# Patient Record
Sex: Male | Born: 1990 | Smoking: Former smoker
Health system: Southern US, Community
[De-identification: ages and names within clinical notes are randomized; demographics above are authoritative.]

## PROBLEM LIST (undated history)

## (undated) DIAGNOSIS — I10 Essential (primary) hypertension: Secondary | ICD-10-CM

## (undated) DIAGNOSIS — K219 Gastro-esophageal reflux disease without esophagitis: Secondary | ICD-10-CM

## (undated) DIAGNOSIS — M25562 Pain in left knee: Secondary | ICD-10-CM

## (undated) DIAGNOSIS — M25561 Pain in right knee: Secondary | ICD-10-CM

## (undated) DIAGNOSIS — I071 Rheumatic tricuspid insufficiency: Secondary | ICD-10-CM

## (undated) DIAGNOSIS — E039 Hypothyroidism, unspecified: Secondary | ICD-10-CM

## (undated) DIAGNOSIS — F419 Anxiety disorder, unspecified: Secondary | ICD-10-CM

## (undated) DIAGNOSIS — F32A Depression, unspecified: Secondary | ICD-10-CM

## (undated) DIAGNOSIS — J45909 Unspecified asthma, uncomplicated: Secondary | ICD-10-CM

## (undated) DIAGNOSIS — G8929 Other chronic pain: Secondary | ICD-10-CM

## (undated) DIAGNOSIS — D72819 Decreased white blood cell count, unspecified: Secondary | ICD-10-CM

## (undated) HISTORY — DX: Anxiety disorder, unspecified: F41.9

## (undated) HISTORY — DX: Decreased white blood cell count, unspecified: D72.819

## (undated) HISTORY — DX: Other chronic pain: G89.29

## (undated) HISTORY — DX: Essential (primary) hypertension: I10

## (undated) HISTORY — DX: Rheumatic tricuspid insufficiency: I07.1

## (undated) HISTORY — DX: Depression, unspecified: F32.A

## (undated) HISTORY — DX: Unspecified asthma, uncomplicated: J45.909

## (undated) HISTORY — DX: Gastro-esophageal reflux disease without esophagitis: K21.9

## (undated) HISTORY — DX: Hypothyroidism, unspecified: E03.9

## (undated) HISTORY — DX: Pain in right knee: M25.561

## (undated) HISTORY — DX: Pain in left knee: M25.562

---

## 2014-01-28 DIAGNOSIS — J45909 Unspecified asthma, uncomplicated: Secondary | ICD-10-CM | POA: Insufficient documentation

## 2014-10-26 DIAGNOSIS — H52223 Regular astigmatism, bilateral: Secondary | ICD-10-CM | POA: Insufficient documentation

## 2014-10-26 DIAGNOSIS — H5213 Myopia, bilateral: Secondary | ICD-10-CM | POA: Insufficient documentation

## 2018-06-19 ENCOUNTER — Inpatient Hospital Stay
Admit: 2018-06-19 | Discharge: 2018-06-19 | Disposition: A | Discharge status: Home / Self Care | Payer: TRICARE (CHAMPUS) | Attending: Emergency Medicine

## 2018-06-19 ENCOUNTER — Emergency Department: Admit: 2018-06-19 | Payer: TRICARE (CHAMPUS)

## 2018-06-19 DIAGNOSIS — S83015A Lateral dislocation of left patella, initial encounter: Secondary | ICD-10-CM

## 2018-06-19 MED ORDER — PROPOFOL 10 MG/ML IV EMUL
10 mg/mL | INTRAVENOUS | Status: AC
Start: 2018-06-19 — End: 2018-06-19
  Administered 2018-06-19 (×3): via INTRAVENOUS

## 2018-06-19 MED ORDER — FENTANYL CITRATE (PF) 50 MCG/ML IJ SOLN
50 mcg/mL | Freq: Once | INTRAMUSCULAR | Status: AC
Start: 2018-06-19 — End: 2018-06-19
  Administered 2018-06-19: 16:00:00 via INTRAVENOUS

## 2018-06-19 MED ORDER — FENTANYL CITRATE (PF) 50 MCG/ML IJ SOLN
50 mcg/mL | INTRAMUSCULAR | Status: AC
Start: 2018-06-19 — End: 2018-06-19
  Administered 2018-06-19: 16:00:00 via INTRAVENOUS

## 2018-06-19 MED ORDER — IBUPROFEN 800 MG TAB
800 mg | ORAL_TABLET | Freq: Three times a day (TID) | ORAL | 0 refills | Status: AC
Start: 2018-06-19 — End: 2018-06-24

## 2018-06-19 MED FILL — FENTANYL CITRATE (PF) 50 MCG/ML IJ SOLN: 50 mcg/mL | INTRAMUSCULAR | Qty: 2

## 2018-06-19 MED FILL — PROPOFOL 10 MG/ML IV EMUL: 10 mg/mL | INTRAVENOUS | Qty: 20

## 2018-06-19 NOTE — ED Notes (Signed)
Pt arrived via [facility] with c/o dislocation left patellar. Pain 10/10

## 2018-06-19 NOTE — ED Notes (Signed)
I have reviewed discharge instruction and prescriptions with patient.  Patient verbalized understanding and has no further questions at this time.  Education taught and patient verbalized understanding of education.  Teach back method used.     Right ac IV removed, catheter tip intact on removal.  Armband removed and shredded per patients request.    Patients pain 4/10.  Belongings given to patient.  Patient discharged with friends to home.

## 2018-06-19 NOTE — ED Notes (Signed)
Pt discharged via w/c.

## 2018-06-19 NOTE — ED Provider Notes (Signed)
EMERGENCY DEPARTMENT HISTORY AND PHYSICAL EXAM    11:51 AM      Date: (Not on file)  Patient Name: Grant Cortez    History of Presenting Illness     No chief complaint on file.        History Provided By: patient    Additional History (Context): Grant Cortez is a 27 y.o. male [place] and made a simple turn and placing torque on his knee and sustained severe leg pain.????He has a obvious patellar deformity it appears the patella is displaced laterally and internally rotated.????His pain is severe with certain movements at rest.????EMS gave him 50 mcg of fentanyl in route.    History and review of systems limited by critical illness.Marland Kitchen    PCP: Grant Plumber, MD    Chief Complaint:   Duration:????  Timing:????  Location:   Quality:   Severity:   Modifying Factors:   Associated Symptoms:           Past History     Past Medical History:  Past Medical History:   Diagnosis Date   ??? Hypertension????      Past Surgical History:  No past surgical history on file.    Family History:  No family history on file.    Social History:  Social History     Tobacco Use   ??? Smoking status: Current Some Day Smoker   Substance Use Topics   ??? Alcohol use: Yes   ????Comment: social   ??? Drug use: Not on file       Allergies:  Allergies   Allergen Reactions   ??? Penicillins Hives         Review of Systems     Review of Systems   Respiratory: Negative for shortness of breath.????  Cardiovascular: Negative for chest pain.   Musculoskeletal: Positive for joint swelling.         Physical Exam       No data found.    Physical Exam   Constitutional: He appears well-developed and well-nourished.   HENT:   Head: Normocephalic and atraumatic.   Eyes: Conjunctivae are normal. No scleral icterus.   Neck: Normal range of motion. Neck supple. No JVD present.   Cardiovascular: Normal rate, regular rhythm and normal heart sounds.   4 intact extremity pulses   Pulmonary/Chest: Effort normal and breath sounds normal.   Abdominal:  Soft. He exhibits no mass. There is no tenderness.   Musculoskeletal: Normal range of motion.   Left leg splinted by EMS and this was removed strong dorsal pedis and posterior tibial pulses motor and sensory intact, patella is displaced laterally and internally rotated.   Lymphadenopathy:   ????He has no cervical adenopathy.   Neurological: He is alert.   Skin: Skin is warm and dry.   Nursing note and vitals reviewed.        Diagnostic Study Results   Labs -  No results found for this or any previous visit (from the past 12 hour(s)).    Radiologic Studies -   No orders to display     No results found.    Medications ordered:   Medications - No data to display????    Medical Decision Making   Initial Medical Decision Making and DDx:  Clinically this is a patellar dislocation.????Do not suspect fracture given the mechanism of injury.  Had informed discussion with the patient including options of performing x-ray for fracture which I  do not think will be present, additional pain medication which will take more time or attempt immediate reduction and he agrees with attempting immediate reduction.????This was attempted with placing trying to displace the patella medially.????It would not move the patient had great pain and so he stopped.????Whole effort lasted a few seconds.????No alarming complications.????We will give him additional pain medication and try reduction.    ED Course: Progress Notes, Reevaluation, and Consults:    Procedural Sedation  Date/Time: 06/19/2018 12:33 PM  Performed by: Donzetta Sprung, MD  Authorized by: Donzetta Sprung, MD     Consent:     Consent obtained:  Written    Consent given by:  Patient    Risks discussed:  Respiratory compromise necessitating ventilatory assistance and intubation    Alternatives discussed:  Analgesia without sedation  Universal protocol:     Patient identity confirmation method:  Verbally with patient, arm band and provided demographic data  Indications:     Sedation is required to  allow for: dislocation reduction.    Procedure necessitating sedation performed by:  Different physician    Intended level of sedation:  Moderate (conscious sedation)  Pre-sedation assessment:     Time since last food or drink:  Last oral 6:30 am    ASA classification: class 1 - normal, healthy patient      Neck mobility: normal      Mouth opening:  3 or more finger widths    Thyromental distance:  4 finger widths    Mallampati score:  I - soft palate, uvula, fauces, pillars visible    Pre-sedation assessments completed and reviewed: airway patency and mental status      History of difficult intubation: no      Pre-sedation assessment completed:  06/19/2018 12:34 PM  Procedure details (see MAR for exact dosages):     Sedation start time:  06/19/2018 12:45 PM    Preoxygenation:  Nasal cannula    Sedation:  Propofol    Intra-procedure monitoring:  Blood pressure monitoring, cardiac monitor, continuous capnometry, continuous pulse oximetry, frequent LOC assessments and frequent vital sign checks    Intra-procedure events: none      Sedation end time:  06/19/2018 12:51 PM  Post-procedure details:     Post-sedation assessment completed:  06/19/2018 12:58 PM    Attendance: Constant attendance by certified staff until patient recovered      Recovery: Patient returned to pre-procedure baseline      Patient is stable for discharge or admission: yes      Patient tolerance:  Tolerated well, no immediate complications  DISLOCATION LOWER-EXT (ASAP ONLY)  Date/Time: 06/19/2018 12:59 PM  Performed by: Deborra Medina, MD  Authorized by: Deborra Medina, MD     Consent:     Consent obtained:  Written    Consent given by:  Patient    Alternatives discussed:  No treatment and alternative treatment  Location:     Location:  Knee    Knee injury location:  L knee    Knee dislocation type: patellar    Pre-procedure details:     Distal perfusion: normal      Range of motion: reduced    Sedation:     Sedation type:  Moderate  (conscious) sedation  Procedure details:     Manipulation performed: yes      Knee reduction method: Patellar manipulation.  Post-procedure details:     Neurological function: normal      Distal perfusion: normal  Range of motion: improved      Patient tolerance of procedure:  Tolerated well, no immediate complications      1:36 PM Pt reevaluated at this time.  Discussed results and findings, as well as, diagnosis and plan for discharge. Follow up with doctors/services listed.  Return to the emergency department for alarming symptoms.  Pt verbalizes understanding and agreement with plan. All questions addressed.    His pain at a 4 out of 10, discussed use of NSAIDs, cold therapy, rest, Ace wrap, follow-up with orthopedics.  Questions answered.  He is entirely recovered from his sedation.      I am the first provider for this patient.    I reviewed the vital signs, available nursing notes, past medical history, past surgical history, family history and social history.    No data found.    Vital Signs-Reviewed the patient's vital signs.    Pulse Oximetry Analysis, Cardiac Monitor, 12 lead ekg:  ??   Interpreted by the EP.      Records Reviewed: Nursing notes reviewed (Time of Review: 11:51 AM)    Procedures:   Critical Care Time:   Aspirin: (was aspirin given for stroke?)    Diagnosis     Clinical Impression: No diagnosis found.    Disposition:       Follow-up Information????  None        Patient's Medications   No medications on file     _______________________________    Notes:????  Deborra Medinaharles I Finnlee Guarnieri, MD using Dragon dictation????????

## 2018-06-19 NOTE — ED Notes (Signed)
Pt waiting on Xray disc.

## 2018-06-19 NOTE — ED Notes (Signed)
Procedural Sedation  Date/Time: 06/19/2018 2:10 PM  Performed by: Donzetta SprungSingh, Breckin Zafar, MD  Authorized by: Donzetta SprungSingh, Sparkles Mcneely, MD     Consent:     Consent obtained:  Written    Consent given by:  Patient    Risks discussed:  Allergic reaction, dysrhythmia, inadequate sedation, nausea, vomiting, respiratory compromise necessitating ventilatory assistance and intubation, prolonged sedation necessitating reversal and prolonged hypoxia resulting in organ damage    Alternatives discussed:  Analgesia without sedation  Universal protocol:     Procedure explained and questions answered to patient or proxy's satisfaction: yes      Relevant documents present and verified: yes      Test results available and properly labeled: yes      Imaging studies available: yes      Required blood products, implants, devices, and special equipment available: yes      Site/side marked: yes      Immediately prior to procedure a time out was called: yes      Patient identity confirmation method:  Verbally with patient and hospital-assigned identification number  Indications:     Procedure performed:  Dislocation reduction    Procedure necessitating sedation performed by:  Different physician    Intended level of sedation:  Deep  Pre-sedation assessment:     NPO status caution: urgency dictates proceeding with non-ideal NPO status      Neck mobility: normal      Mouth opening:  3 or more finger widths    Mallampati score:  I - soft palate, uvula, fauces, pillars visible    Pre-sedation assessments completed and reviewed: airway patency, cardiovascular function, hydration status, mental status, nausea/vomiting, pain level, respiratory function and temperature      History of difficult intubation: no      Pre-sedation assessment completed:  06/19/2018 12:30 PM  Immediate pre-procedure details:     Reviewed: vital signs, relevant labs/tests and NPO status      Verified: bag valve mask available, emergency equipment available, intubation equipment  available, IV patency confirmed, oxygen available, reversal medications available and suction available    Procedure details (see MAR for exact dosages):     Sedation start time:  06/19/2018 12:30 PM    Preoxygenation:  Nasal cannula    Sedation:  Propofol    Analgesia:  Fentanyl    Intra-procedure monitoring:  Blood pressure monitoring, cardiac monitor, continuous capnometry, continuous pulse oximetry, frequent LOC assessments and frequent vital sign checks    Intra-procedure events: none      Sedation end time:  06/19/2018 12:45 PM    Total sedation time (minutes):  15  Post-procedure details:     Post-sedation assessment completed:  06/19/2018 12:50 PM    Attendance: Constant attendance by certified staff until patient recovered      Recovery: Patient returned to pre-procedure baseline      Estimated blood loss (see I/O flowsheets): no      Post-sedation assessments completed and reviewed: airway patency, cardiovascular function, hydration status, mental status, nausea/vomiting, pain level, respiratory function and temperature      Specimens recovered:  None    Patient is stable for discharge or admission: yes      Patient tolerance:  Tolerated well, no immediate complications

## 2018-06-19 NOTE — ED Notes (Signed)
Pt waiting on Xray disc.

## 2018-06-19 NOTE — ED Notes (Signed)
Procedural Sedation  Date/Time: 06/19/2018 2:10 PM  Performed by: Donzetta SprungSingh, Alexandra Lipps, MD  Authorized by: Donzetta SprungSingh, Ryett Hamman, MD     Consent:     Consent obtained:  Written    Consent given by:  Patient    Risks discussed:  Allergic reaction, dysrhythmia, inadequate sedation, nausea, vomiting, respiratory compromise necessitating ventilatory assistance and intubation, prolonged sedation necessitating reversal and prolonged hypoxia resulting in organ damage    Alternatives discussed:  Analgesia without sedation  Universal protocol:     Procedure explained and questions answered to patient or proxy's satisfaction: yes      Relevant documents present and verified: yes      Test results available and properly labeled: yes      Imaging studies available: yes      Required blood products, implants, devices, and special equipment available: yes      Site/side marked: yes      Immediately prior to procedure a time out was called: yes      Patient identity confirmation method:  Verbally with patient and hospital-assigned identification number  Indications:     Procedure performed:  Dislocation reduction    Procedure necessitating sedation performed by:  Different physician    Intended level of sedation:  Deep  Pre-sedation assessment:     NPO status caution: urgency dictates proceeding with non-ideal NPO status      Neck mobility: normal      Mouth opening:  3 or more finger widths    Mallampati score:  I - soft palate, uvula, fauces, pillars visible    Pre-sedation assessments completed and reviewed: airway patency, cardiovascular function, hydration status, mental status, nausea/vomiting, pain level, respiratory function and temperature      History of difficult intubation: no      Pre-sedation assessment completed:  06/19/2018 12:30 PM  Immediate pre-procedure details:     Reviewed: vital signs, relevant labs/tests and NPO status      Verified: bag valve mask available, emergency equipment available,  intubation equipment available, IV patency confirmed, oxygen available, reversal medications available and suction available    Procedure details (see MAR for exact dosages):     Sedation start time:  06/19/2018 12:30 PM    Preoxygenation:  Nasal cannula    Sedation:  Propofol    Analgesia:  Fentanyl    Intra-procedure monitoring:  Blood pressure monitoring, cardiac monitor, continuous capnometry, continuous pulse oximetry, frequent LOC assessments and frequent vital sign checks    Intra-procedure events: none      Sedation end time:  06/19/2018 12:45 PM    Total sedation time (minutes):  15  Post-procedure details:     Post-sedation assessment completed:  06/19/2018 12:50 PM    Attendance: Constant attendance by certified staff until patient recovered      Recovery: Patient returned to pre-procedure baseline      Estimated blood loss (see I/O flowsheets): no      Post-sedation assessments completed and reviewed: airway patency, cardiovascular function, hydration status, mental status, nausea/vomiting, pain level, respiratory function and temperature      Specimens recovered:  None    Patient is stable for discharge or admission: yes      Patient tolerance:  Tolerated well, no immediate complications

## 2018-06-19 NOTE — ED Notes (Signed)
I have reviewed discharge instruction and prescriptions with patient.  Patient verbalized understanding and has no further questions at this time.  Education taught and patient verbalized understanding of education.  Teach back method used.     Right ac IV removed, catheter tip intact on removal.  Armband removed and shredded per patients request.    Patients pain 4/10.  Belongings given to patient.  Patient discharged with friends to home.

## 2018-06-19 NOTE — ED Triage Notes (Signed)
Pt arrived via [facility] with c/o dislocation left patellar. Pain 10/10

## 2018-06-19 NOTE — ED Provider Notes (Signed)
EMERGENCY DEPARTMENT HISTORY AND PHYSICAL EXAM    11:51 AM      Date: (Not on file)  Patient Name: Grant Cortez    History of Presenting Illness     No chief complaint on file.        History Provided By: patient    Additional History (Context): Grant Cortez is a 27 y.o. male [place] and made a simple turn and placing torque on his knee and sustained severe leg pain.????He has a obvious patellar deformity it appears the patella is displaced laterally and internally rotated.????His pain is severe with certain movements at rest.????EMS gave him 50 mcg of fentanyl in route.    History and review of systems limited by critical illness.Marland Kitchen    PCP: Karle Plumber, MD    Chief Complaint:   Duration:????  Timing:????  Location:   Quality:   Severity:   Modifying Factors:   Associated Symptoms:           Past History     Past Medical History:  Past Medical History:   Diagnosis Date   ??? Hypertension????      Past Surgical History:  No past surgical history on file.    Family History:  No family history on file.    Social History:  Social History     Tobacco Use   ??? Smoking status: Current Some Day Smoker   Substance Use Topics   ??? Alcohol use: Yes   ????Comment: social   ??? Drug use: Not on file       Allergies:  Allergies   Allergen Reactions   ??? Penicillins Hives         Review of Systems     Review of Systems   Respiratory: Negative for shortness of breath.????  Cardiovascular: Negative for chest pain.   Musculoskeletal: Positive for joint swelling.         Physical Exam       No data found.    Physical Exam   Constitutional: He appears well-developed and well-nourished.   HENT:   Head: Normocephalic and atraumatic.   Eyes: Conjunctivae are normal. No scleral icterus.   Neck: Normal range of motion. Neck supple. No JVD present.   Cardiovascular: Normal rate, regular rhythm and normal heart sounds.   4 intact extremity pulses   Pulmonary/Chest: Effort normal and breath sounds normal.    Abdominal: Soft. He exhibits no mass. There is no tenderness.   Musculoskeletal: Normal range of motion.   Left leg splinted by EMS and this was removed strong dorsal pedis and posterior tibial pulses motor and sensory intact, patella is displaced laterally and internally rotated.   Lymphadenopathy:   ????He has no cervical adenopathy.   Neurological: He is alert.   Skin: Skin is warm and dry.   Nursing note and vitals reviewed.        Diagnostic Study Results   Labs -  No results found for this or any previous visit (from the past 12 hour(s)).    Radiologic Studies -   No orders to display     No results found.    Medications ordered:   Medications - No data to display????    Medical Decision Making   Initial Medical Decision Making and DDx:  Clinically this is a patellar dislocation.????Do not suspect fracture given the mechanism of injury.  Had informed discussion with the patient including options of performing x-ray for fracture which I  do not think will be present, additional pain medication which will take more time or attempt immediate reduction and he agrees with attempting immediate reduction.????This was attempted with placing trying to displace the patella medially.????It would not move the patient had great pain and so he stopped.????Whole effort lasted a few seconds.????No alarming complications.????We will give him additional pain medication and try reduction.    ED Course: Progress Notes, Reevaluation, and Consults:    Procedural Sedation  Date/Time: 06/19/2018 12:33 PM  Performed by: Donzetta Sprung, MD  Authorized by: Donzetta Sprung, MD     Consent:     Consent obtained:  Written    Consent given by:  Patient    Risks discussed:  Respiratory compromise necessitating ventilatory assistance and intubation    Alternatives discussed:  Analgesia without sedation  Universal protocol:     Patient identity confirmation method:  Verbally with patient, arm band and provided demographic data  Indications:      Sedation is required to allow for: dislocation reduction.    Procedure necessitating sedation performed by:  Different physician    Intended level of sedation:  Moderate (conscious sedation)  Pre-sedation assessment:     Time since last food or drink:  Last oral 6:30 am    ASA classification: class 1 - normal, healthy patient      Neck mobility: normal      Mouth opening:  3 or more finger widths    Thyromental distance:  4 finger widths    Mallampati score:  I - soft palate, uvula, fauces, pillars visible    Pre-sedation assessments completed and reviewed: airway patency and mental status      History of difficult intubation: no      Pre-sedation assessment completed:  06/19/2018 12:34 PM  Procedure details (see MAR for exact dosages):     Sedation start time:  06/19/2018 12:45 PM    Preoxygenation:  Nasal cannula    Sedation:  Propofol    Intra-procedure monitoring:  Blood pressure monitoring, cardiac monitor, continuous capnometry, continuous pulse oximetry, frequent LOC assessments and frequent vital sign checks    Intra-procedure events: none      Sedation end time:  06/19/2018 12:51 PM  Post-procedure details:     Post-sedation assessment completed:  06/19/2018 12:58 PM    Attendance: Constant attendance by certified staff until patient recovered      Recovery: Patient returned to pre-procedure baseline      Patient is stable for discharge or admission: yes      Patient tolerance:  Tolerated well, no immediate complications  DISLOCATION LOWER-EXT (ASAP ONLY)  Date/Time: 06/19/2018 12:59 PM  Performed by: Deborra Medina, MD  Authorized by: Deborra Medina, MD     Consent:     Consent obtained:  Written    Consent given by:  Patient    Alternatives discussed:  No treatment and alternative treatment  Location:     Location:  Knee    Knee injury location:  L knee    Knee dislocation type: patellar    Pre-procedure details:     Distal perfusion: normal      Range of motion: reduced    Sedation:      Sedation type:  Moderate (conscious) sedation  Procedure details:     Manipulation performed: yes      Knee reduction method: Patellar manipulation.  Post-procedure details:     Neurological function: normal      Distal perfusion: normal  Range of motion: improved      Patient tolerance of procedure:  Tolerated well, no immediate complications      1:36 PM Pt reevaluated at this time.  Discussed results and findings, as well as, diagnosis and plan for discharge. Follow up with doctors/services listed.  Return to the emergency department for alarming symptoms.  Pt verbalizes understanding and agreement with plan. All questions addressed.    His pain at a 4 out of 10, discussed use of NSAIDs, cold therapy, rest, Ace wrap, follow-up with orthopedics.  Questions answered.  He is entirely recovered from his sedation.      I am the first provider for this patient.    I reviewed the vital signs, available nursing notes, past medical history, past surgical history, family history and social history.    No data found.    Vital Signs-Reviewed the patient's vital signs.    Pulse Oximetry Analysis, Cardiac Monitor, 12 lead ekg:  ??   Interpreted by the EP.      Records Reviewed: Nursing notes reviewed (Time of Review: 11:51 AM)    Procedures:   Critical Care Time:   Aspirin: (was aspirin given for stroke?)    Diagnosis     Clinical Impression: No diagnosis found.    Disposition:       Follow-up Information????  None        Patient's Medications   No medications on file     _______________________________    Notes:????  Deborra Medinaharles I Armando Lauman, MD using Dragon dictation????????

## 2019-07-25 DIAGNOSIS — H5210 Myopia, unspecified eye: Secondary | ICD-10-CM | POA: Insufficient documentation

## 2020-02-15 ENCOUNTER — Emergency Department: Admit: 2020-02-16

## 2020-02-15 DIAGNOSIS — F418 Other specified anxiety disorders: Secondary | ICD-10-CM

## 2020-02-15 NOTE — ED Notes (Signed)
 Patient has been provided discharge instructions.  My Chart and [facility] has been discussed, and if any prescriptions were provided or called in, they have been reviewed with the patient, as well.  Allowed time for questions and clarification.

## 2020-02-15 NOTE — ED Notes (Signed)
Chest pain left side. Tender in upper left chest to palpation.  States he is nauseous.    No diarrhea. Did not take any ibuprofen or tylenol before coming in    Xray just done    labds drawn after iv inserted    Ride home is girlfriend

## 2020-02-15 NOTE — ED Provider Notes (Signed)
EMERGENCY DEPARTMENT HISTORY AND PHYSICAL EXAM      Date: 02/15/2020  Patient Name: Grant Cortez    History of Presenting Illness     Chief Complaint   Patient presents with   ??? Chest Pain       History (Context): Raymont Andreoni is a 29 y.o. gentleman with active conditions as noted in the past medical history who presents with subacute onset, progressive, mild to moderate, intermittent chest pain has been happening on and off over a long period of time..        On review of systems, the patient denies fever, chills, headache, anhedonia, current drug use, homicidal ideation, suicidal ideation.    PCP: None        Past History     Past Medical History:  Past Medical History:   Diagnosis Date   ??? Psychiatric disorder     anxiety       Past Surgical History:  History reviewed. No pertinent surgical history.    Family History:  History reviewed. No pertinent family history.    Social History:  Social History     Tobacco Use   ??? Smoking status: Current Every Day Smoker     Last attempt to quit: 11/25/2017     Years since quitting: 2.2   ??? Smokeless tobacco: Never Used   Substance Use Topics   ??? Alcohol use: Yes     Alcohol/week: 14.0 standard drinks     Types: 14 Shots of liquor per week   ??? Drug use: Not Currently       Allergies:  No Known Allergies    PMH, PSH, family history, social history, allergies reviewed with the patient with significant items noted above.  Review of Systems   As per HPI, otherwise reviewed and negative.     Physical Exam     Vitals:    02/15/20 2030 02/15/20 2045 02/15/20 2100 02/15/20 2115   BP: 131/85 138/76 137/72 122/68   Pulse: (!) 57 (!) 58 (!) 55 (!) 58   Resp: 21 25 20 15    Temp:       SpO2: 100% 100% 100% 99%   Weight:       Height:           Gen: Anxious appearing and appears mildly distressed  HEENT: Normocephalic, sclera anicteric  Cardiovascular: Normal rate, regular rhythm, no murmurs, rubs, gallops.  Pulses intact and equal distally.  Pulmonary: No respiratory distress.  No  stridor.  Clear lungs. Tachypneic  ABD: Soft, nontender, nondistended.  Neuro: Alert.  Normal speech.  Normal mentation.  Psych: Dress: Normal. Behavior: Anxious. Thought Process: Linear.  Thought content: Centers around symptoms.    GU: No CVA tenderness  EXT: Moves all extremities well.  No cyanosis or clubbing.  Skin: Warm and well-perfused.        Diagnostic Study Results     Labs -     No results found for this or any previous visit (from the past 12 hour(s)).    Radiologic Studies -   XR CHEST PORT   Final Result      No active cardiopulmonary disease.           CT Results  (Last 48 hours)    None        CXR Results  (Last 48 hours)    None            Medical Decision Making   I am the first provider for  this patient.    I reviewed the vital signs, available nursing notes, past medical history, past surgical history, family history and social history.    Vital Signs-Reviewed the patient's vital signs.    EKG:  Interpreted by myself.      Records Reviewed: Personally, on initial evaluation    MDM:   Patient presents with chest pain and anxiety.  Exam significant for normal exam with exception of anxiety.  Patient condition stable.   DDX considered: Anxiety, metabolic abnormality, endocrine abnormality  DDX thought to be less likely but also considered due to high risk condition: Cardiopulmonary dysfunction    Plan:   Reassurance and treatment    Orders as below:  Orders Placed This Encounter   ??? XR CHEST PORT   ??? CBC WITH AUTOMATED DIFF   ??? BASIC METABOLIC PANEL   ??? TROPONIN I   ??? EKG, 12 LEAD, INITIAL   ??? citalopram (CeleXA) 20 mg tablet        ED Course:   Work-up as above negative for other cause of acute chest pain.    DISCHARGE NOTE:     Care plan outlined and precautions discussed.  Results were reviewed with the patient. All medications were reviewed with the patient; will d/c home with citalopram. All of pt's questions and concerns were addressed.  Alarm symptoms and return precautions associated with  chief complaint and evaluation were reviewed with the patient in detail.  The patient demonstrated adequate understanding.  Patient was instructed and agrees to follow up with psychiatry, as well as to return to the ED upon further deterioration. Patient is ready to go home.  The patient is happy with this plan    Follow-up Information     Follow up With Specialties Details Why [place]    [facility] EMERGENCY DEPT Emergency Medicine Go to  As needed, If symptoms worsen [place]  [phone]    Your behavioral health provider  Call in 1 day            Discharge Medication List as of 02/15/2020  9:32 PM            Diagnosis     Clinical Impression:   1. Anxiety associated with depression        Signed,  Denali Sharma Geni Bers, MD  Emergency Physician  [facility]  [place]    As a voice dictation software was utilized to dictate this note, minor word transpositions can occur.  I apologize for confusing wording and typographic errors.  Please feel free to contact me for clarification.

## 2020-02-15 NOTE — ED Notes (Signed)
 Ambulatory to triage. C/o intermittent left-sided chest pain with shortness of breath. Sometimes I feel like I forget to breathe. Reports he has had some stress triggers recently and has been feeling anxious. My uncle died a year ago from covid and his birthday was recently. And I'm about to move to [place]  so I'm trying to get my money right.

## 2020-02-15 NOTE — ED Triage Notes (Addendum)
Ambulatory to triage. C/o intermittent left-sided chest pain with shortness of breath. "Sometimes I feel like I forget to breathe." Reports he has had some stress triggers recently and has been feeling anxious. "My uncle died a year ago from covid and his birthday was recently. And I'm about to move to [place] so I'm trying to get my money right."

## 2020-02-15 NOTE — ED Provider Notes (Signed)
EMERGENCY DEPARTMENT HISTORY AND PHYSICAL EXAM      Date: 02/15/2020  Patient Name: Grant Cortez    History of Presenting Illness     Chief Complaint   Patient presents with   ??? Chest Pain       History (Context): Raymont Andreoni is a 29 y.o. gentleman with active conditions as noted in the past medical history who presents with subacute onset, progressive, mild to moderate, intermittent chest pain has been happening on and off over a long period of time..        On review of systems, the patient denies fever, chills, headache, anhedonia, current drug use, homicidal ideation, suicidal ideation.    PCP: None        Past History     Past Medical History:  Past Medical History:   Diagnosis Date   ??? Psychiatric disorder     anxiety       Past Surgical History:  History reviewed. No pertinent surgical history.    Family History:  History reviewed. No pertinent family history.    Social History:  Social History     Tobacco Use   ??? Smoking status: Current Every Day Smoker     Last attempt to quit: 11/25/2017     Years since quitting: 2.2   ??? Smokeless tobacco: Never Used   Substance Use Topics   ??? Alcohol use: Yes     Alcohol/week: 14.0 standard drinks     Types: 14 Shots of liquor per week   ??? Drug use: Not Currently       Allergies:  No Known Allergies    PMH, PSH, family history, social history, allergies reviewed with the patient with significant items noted above.  Review of Systems   As per HPI, otherwise reviewed and negative.     Physical Exam     Vitals:    02/15/20 2030 02/15/20 2045 02/15/20 2100 02/15/20 2115   BP: 131/85 138/76 137/72 122/68   Pulse: (!) 57 (!) 58 (!) 55 (!) 58   Resp: 21 25 20 15    Temp:       SpO2: 100% 100% 100% 99%   Weight:       Height:           Gen: Anxious appearing and appears mildly distressed  HEENT: Normocephalic, sclera anicteric  Cardiovascular: Normal rate, regular rhythm, no murmurs, rubs, gallops.  Pulses intact and equal distally.  Pulmonary: No respiratory distress.  No  stridor.  Clear lungs. Tachypneic  ABD: Soft, nontender, nondistended.  Neuro: Alert.  Normal speech.  Normal mentation.  Psych: Dress: Normal. Behavior: Anxious. Thought Process: Linear.  Thought content: Centers around symptoms.    GU: No CVA tenderness  EXT: Moves all extremities well.  No cyanosis or clubbing.  Skin: Warm and well-perfused.        Diagnostic Study Results     Labs -     No results found for this or any previous visit (from the past 12 hour(s)).    Radiologic Studies -   XR CHEST PORT   Final Result      No active cardiopulmonary disease.           CT Results  (Last 48 hours)    None        CXR Results  (Last 48 hours)    None            Medical Decision Making   I am the first provider for  this patient.    I reviewed the vital signs, available nursing notes, past medical history, past surgical history, family history and social history.    Vital Signs-Reviewed the patient's vital signs.    EKG:  Interpreted by myself.      Records Reviewed: Personally, on initial evaluation    MDM:   Patient presents with chest pain and anxiety.  Exam significant for normal exam with exception of anxiety.  Patient condition stable.   DDX considered: Anxiety, metabolic abnormality, endocrine abnormality  DDX thought to be less likely but also considered due to high risk condition: Cardiopulmonary dysfunction    Plan:   Reassurance and treatment    Orders as below:  Orders Placed This Encounter   ??? XR CHEST PORT   ??? CBC WITH AUTOMATED DIFF   ??? BASIC METABOLIC PANEL   ??? TROPONIN I   ??? EKG, 12 LEAD, INITIAL   ??? citalopram (CeleXA) 20 mg tablet        ED Course:   Work-up as above negative for other cause of acute chest pain.    DISCHARGE NOTE:     Care plan outlined and precautions discussed.  Results were reviewed with the patient. All medications were reviewed with the patient; will d/c home with citalopram. All of pt's questions and concerns were addressed.  Alarm symptoms and return precautions associated with  chief complaint and evaluation were reviewed with the patient in detail.  The patient demonstrated adequate understanding.  Patient was instructed and agrees to follow up with psychiatry, as well as to return to the ED upon further deterioration. Patient is ready to go home.  The patient is happy with this plan    Follow-up Information     Follow up With Specialties Details Why Contact Info    [facility] EMERGENCY DEPT Emergency Medicine Go to  As needed, If symptoms worsen [address]  [place] [address]  [phone]    Your behavioral health provider  Call in 1 day            Discharge Medication List as of 02/15/2020  9:32 PM            Diagnosis     Clinical Impression:   1. Anxiety associated with depression        Signed,  Cecia Egge M Fleming Prill, MD  Emergency Physician  [facility]  [facility]    As a voice dictation software was utilized to dictate this note, minor word transpositions can occur.  I apologize for confusing wording and typographic errors.  Please feel free to contact me for clarification.

## 2020-02-15 NOTE — ED Notes (Signed)
Patient has been provided discharge instructions.  My Chart and [facility] has been discussed, and if any prescriptions were provided or called in, they have been reviewed with the patient, as well.  Allowed time for questions and clarification.

## 2020-02-15 NOTE — ED Notes (Signed)
Chest pain left side. Tender in upper left chest to palpation.  States he is nauseous.    No diarrhea. Did not take any ibuprofen or tylenol before coming in    Xray just done    labds drawn after iv inserted    Ride home is girlfriend

## 2020-02-16 ENCOUNTER — Inpatient Hospital Stay
Admit: 2020-02-16 | Discharge: 2020-02-16 | Disposition: A | Discharge status: Home / Self Care | Attending: Emergency Medicine

## 2020-02-16 LAB — METABOLIC PANEL, BASIC
Anion gap: 4 mmol/L (ref 3.0–18)
BUN/Creatinine ratio: 9 — ABNORMAL LOW (ref 12–20)
BUN: 10 MG/DL (ref 7.0–18)
CO2: 28 mmol/L (ref 21–32)
Calcium: 9 MG/DL (ref 8.5–10.1)
Chloride: 106 mmol/L (ref 100–111)
Creatinine: 1.12 MG/DL (ref 0.6–1.3)
GFR est AA: >60 mL/min/{1.73_m2} (ref >60)
GFR est non-AA: >60 mL/min/{1.73_m2} (ref >60)
Glucose: 96 mg/dL (ref 74–99)
Potassium: 3.7 mmol/L (ref 3.5–5.5)
Sodium: 138 mmol/L (ref 136–145)

## 2020-02-16 LAB — CBC WITH AUTOMATED DIFF
ABS. BASOPHILS: 0.1 10*3/uL (ref 0.0–0.1)
ABS. EOSINOPHILS: 0.1 10*3/uL (ref 0.0–0.4)
ABS. LYMPHOCYTES: 2.1 10*3/uL (ref 0.9–3.6)
ABS. MONOCYTES: 0.7 10*3/uL (ref 0.05–1.2)
ABS. NEUTROPHILS: 2.2 10*3/uL (ref 1.8–8.0)
BASOPHILS: 1 % (ref 0–2)
EOSINOPHILS: 2 % (ref 0–5)
HCT: 43.6 % (ref 36.0–48.0)
HGB: 14.3 g/dL (ref 13.0–16.0)
LYMPHOCYTES: 41 % (ref 21–52)
MCH: 32.1 PG (ref 24.0–34.0)
MCHC: 32.8 g/dL (ref 31.0–37.0)
MCV: 98 FL — ABNORMAL HIGH (ref 74.0–97.0)
MONOCYTES: 13 % — ABNORMAL HIGH (ref 3–10)
MPV: 12.3 FL — ABNORMAL HIGH (ref 9.2–11.8)
NEUTROPHILS: 43 % (ref 40–73)
PLATELET: 179 10*3/uL (ref 135–420)
RBC: 4.45 M/uL — ABNORMAL LOW (ref 4.70–5.50)
RDW: 11.2 % — ABNORMAL LOW (ref 11.6–14.5)
WBC: 5.1 10*3/uL (ref 4.6–13.2)

## 2020-02-16 LAB — TROPONIN I: Troponin-I, QT: <0.02 NG/ML (ref 0.0–0.045)

## 2020-02-16 LAB — CBC WITH AUTO DIFFERENTIAL
Basophils %: 1 % (ref 0–2)
Basophils Absolute: 0.1 10*3/uL (ref 0.0–0.1)
Eosinophils %: 2 % (ref 0–5)
Eosinophils Absolute: 0.1 10*3/uL (ref 0.0–0.4)
Hematocrit: 43.6 % (ref 36.0–48.0)
Hemoglobin: 14.3 g/dL (ref 13.0–16.0)
Lymphocytes %: 41 % (ref 21–52)
Lymphocytes Absolute: 2.1 10*3/uL (ref 0.9–3.6)
MCH: 32.1 PG (ref 24.0–34.0)
MCHC: 32.8 g/dL (ref 31.0–37.0)
MCV: 98 FL — ABNORMAL HIGH (ref 74.0–97.0)
MPV: 12.3 FL — ABNORMAL HIGH (ref 9.2–11.8)
Monocytes %: 13 % — ABNORMAL HIGH (ref 3–10)
Monocytes Absolute: 0.7 10*3/uL (ref 0.05–1.2)
Neutrophils %: 43 % (ref 40–73)
Neutrophils Absolute: 2.2 10*3/uL (ref 1.8–8.0)
Platelets: 179 10*3/uL (ref 135–420)
RBC: 4.45 M/uL — ABNORMAL LOW (ref 4.70–5.50)
RDW: 11.2 % — ABNORMAL LOW (ref 11.6–14.5)
WBC: 5.1 10*3/uL (ref 4.6–13.2)

## 2020-02-16 LAB — BASIC METABOLIC PANEL
Anion Gap: 4 mmol/L (ref 3.0–18)
BUN: 10 MG/DL (ref 7.0–18)
Bun/Cre Ratio: 9 — ABNORMAL LOW (ref 12–20)
CO2: 28 mmol/L (ref 21–32)
Calcium: 9 MG/DL (ref 8.5–10.1)
Chloride: 106 mmol/L (ref 100–111)
Creatinine: 1.12 MG/DL (ref 0.6–1.3)
EGFR IF NonAfrican American: >60 mL/min/{1.73_m2} (ref >60)
GFR African American: >60 mL/min/{1.73_m2} (ref >60)
Glucose: 96 mg/dL (ref 74–99)
Potassium: 3.7 mmol/L (ref 3.5–5.5)
Sodium: 138 mmol/L (ref 136–145)

## 2020-02-16 LAB — TROPONIN: Troponin I: <0.02 NG/ML (ref 0.0–0.045)

## 2020-02-16 MED ORDER — CITALOPRAM 20 MG TAB
20 mg | ORAL_TABLET | Freq: Every day | ORAL | 11 refills | Status: AC
Start: 2020-02-16 — End: ?

## 2020-02-17 LAB — EKG, 12 LEAD, INITIAL
Atrial Rate: 57 {beats}/min
Calculated P Axis: 38 degrees
Calculated R Axis: 7 degrees
Calculated T Axis: 7 degrees
P-R Interval: 164 ms
Q-T Interval: 376 ms
QRS Duration: 82 ms
QTC Calculation (Bezet): 365 ms
Ventricular Rate: 57 {beats}/min

## 2020-02-17 LAB — EKG 12-LEAD
Atrial Rate: 57 {beats}/min
P Axis: 38 degrees
P-R Interval: 164 ms
Q-T Interval: 376 ms
QRS Duration: 82 ms
QTc Calculation (Bazett): 365 ms
R Axis: 7 degrees
T Axis: 7 degrees
Ventricular Rate: 57 {beats}/min

## 2020-07-14 ENCOUNTER — Ambulatory Visit: Payer: Self-pay | Admitting: Internal Medicine

## 2020-10-09 DIAGNOSIS — Z23 Encounter for immunization: Secondary | ICD-10-CM | POA: Diagnosis not present

## 2020-10-09 DIAGNOSIS — F411 Generalized anxiety disorder: Secondary | ICD-10-CM | POA: Diagnosis not present

## 2020-10-24 DIAGNOSIS — M222X2 Patellofemoral disorders, left knee: Secondary | ICD-10-CM | POA: Diagnosis not present

## 2020-10-24 DIAGNOSIS — M25561 Pain in right knee: Secondary | ICD-10-CM | POA: Diagnosis not present

## 2020-10-24 DIAGNOSIS — M25562 Pain in left knee: Secondary | ICD-10-CM | POA: Diagnosis not present

## 2020-10-24 DIAGNOSIS — M222X1 Patellofemoral disorders, right knee: Secondary | ICD-10-CM | POA: Diagnosis not present

## 2020-10-31 DIAGNOSIS — M25562 Pain in left knee: Secondary | ICD-10-CM | POA: Diagnosis not present

## 2020-10-31 DIAGNOSIS — M222X1 Patellofemoral disorders, right knee: Secondary | ICD-10-CM | POA: Diagnosis not present

## 2020-10-31 DIAGNOSIS — M25561 Pain in right knee: Secondary | ICD-10-CM | POA: Diagnosis not present

## 2020-10-31 DIAGNOSIS — M222X2 Patellofemoral disorders, left knee: Secondary | ICD-10-CM | POA: Diagnosis not present

## 2020-11-07 DIAGNOSIS — M222X2 Patellofemoral disorders, left knee: Secondary | ICD-10-CM | POA: Diagnosis not present

## 2020-11-07 DIAGNOSIS — M25561 Pain in right knee: Secondary | ICD-10-CM | POA: Diagnosis not present

## 2020-11-07 DIAGNOSIS — M25562 Pain in left knee: Secondary | ICD-10-CM | POA: Diagnosis not present

## 2020-11-07 DIAGNOSIS — M222X1 Patellofemoral disorders, right knee: Secondary | ICD-10-CM | POA: Diagnosis not present

## 2021-02-13 DIAGNOSIS — M79671 Pain in right foot: Secondary | ICD-10-CM | POA: Diagnosis not present

## 2021-02-13 DIAGNOSIS — M79672 Pain in left foot: Secondary | ICD-10-CM | POA: Diagnosis not present

## 2021-02-19 ENCOUNTER — Other Ambulatory Visit: Payer: Self-pay

## 2021-02-19 DIAGNOSIS — F322 Major depressive disorder, single episode, severe without psychotic features: Secondary | ICD-10-CM | POA: Insufficient documentation

## 2021-02-19 DIAGNOSIS — G8929 Other chronic pain: Secondary | ICD-10-CM | POA: Insufficient documentation

## 2021-02-19 DIAGNOSIS — F411 Generalized anxiety disorder: Secondary | ICD-10-CM | POA: Insufficient documentation

## 2021-02-20 ENCOUNTER — Encounter: Payer: Self-pay | Admitting: Podiatry

## 2021-02-20 ENCOUNTER — Other Ambulatory Visit: Payer: Self-pay

## 2021-02-20 ENCOUNTER — Ambulatory Visit: Payer: BC Managed Care – PPO | Admitting: Podiatry

## 2021-02-20 ENCOUNTER — Ambulatory Visit (INDEPENDENT_AMBULATORY_CARE_PROVIDER_SITE_OTHER): Payer: BC Managed Care – PPO

## 2021-02-20 DIAGNOSIS — M2042 Other hammer toe(s) (acquired), left foot: Secondary | ICD-10-CM

## 2021-02-20 DIAGNOSIS — M21279 Flexion deformity, unspecified ankle and toes: Secondary | ICD-10-CM

## 2021-02-20 DIAGNOSIS — M2142 Flat foot [pes planus] (acquired), left foot: Secondary | ICD-10-CM | POA: Diagnosis not present

## 2021-02-20 DIAGNOSIS — M2011 Hallux valgus (acquired), right foot: Secondary | ICD-10-CM | POA: Diagnosis not present

## 2021-02-20 DIAGNOSIS — M216X2 Other acquired deformities of left foot: Secondary | ICD-10-CM

## 2021-02-20 DIAGNOSIS — Q6689 Other  specified congenital deformities of feet: Secondary | ICD-10-CM

## 2021-02-20 DIAGNOSIS — M25571 Pain in right ankle and joints of right foot: Secondary | ICD-10-CM

## 2021-02-20 DIAGNOSIS — M2012 Hallux valgus (acquired), left foot: Secondary | ICD-10-CM | POA: Diagnosis not present

## 2021-02-20 DIAGNOSIS — M2141 Flat foot [pes planus] (acquired), right foot: Secondary | ICD-10-CM | POA: Diagnosis not present

## 2021-02-20 DIAGNOSIS — M216X1 Other acquired deformities of right foot: Secondary | ICD-10-CM

## 2021-02-20 DIAGNOSIS — M2041 Other hammer toe(s) (acquired), right foot: Secondary | ICD-10-CM

## 2021-02-20 DIAGNOSIS — M25572 Pain in left ankle and joints of left foot: Secondary | ICD-10-CM

## 2021-02-20 DIAGNOSIS — M62462 Contracture of muscle, left lower leg: Secondary | ICD-10-CM

## 2021-02-20 DIAGNOSIS — M62461 Contracture of muscle, right lower leg: Secondary | ICD-10-CM

## 2021-02-20 DIAGNOSIS — M21611 Bunion of right foot: Secondary | ICD-10-CM

## 2021-02-20 DIAGNOSIS — M21612 Bunion of left foot: Secondary | ICD-10-CM

## 2021-02-20 DIAGNOSIS — M21862 Other specified acquired deformities of left lower leg: Secondary | ICD-10-CM

## 2021-02-20 DIAGNOSIS — M21861 Other specified acquired deformities of right lower leg: Secondary | ICD-10-CM

## 2021-02-20 NOTE — Progress Notes (Signed)
Subjective:  Patient ID: Rodney Austin, male    DOB: September 20, 1991,  MRN: [id number]  Chief Complaint  Patient presents with  . Foot Pain    Bilateral foot pain since 2018 PT stated that the pain is on the sides of foot and by the end of the day his feet will throb    30 y.o. male presents with the above complaint. History confirmed with patient.  Really started bother him when he was in the [place] and got worse after he got out.  No injuries that he knows of.  Objective:  Physical Exam: warm, good capillary refill, no trophic changes or ulcerative lesions, normal DP and PT pulses and normal sensory exam.  Bilaterally he has pes planovalgus with pain on palpation of the sinus tarsi and lateral column, pain along the posterior tibial tendon.  Collapse of the medial arch with heel valgus on weightbearing.  Able to do single and double heel rises.  Gastrium equinus with a positive Silfverskiold test.  He has hammertoes and mild hallux valgus deformity bilaterally.  Subtalar joint range of motion is limited and painful bilaterally     Radiographs: X-ray of both feet: Pes planus deformity with spurring at the talonavicular and subtalar joint.  Metatarsus primus elevatus is noted.  He has calcaneal valgus with mild forefoot abduction.  On the oblique view there is some irregularity of the anterior process of calcaneus, no gross osseous coalition is noted Assessment:   1. Pes planus of both feet   2. Gastrocnemius equinus of left lower extremity   3. Gastrocnemius equinus of right lower extremity   4. Hallux valgus with bunions, left   5. Hallux valgus with bunions, right   6. Hammertoe of left foot   7. Hammertoe of right foot   8. Metatarsus primus elevatus, unspecified laterality   9. 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  Discussed with patient and reviewed the clinical exam and  radiographic findings with him in detail.  We discussed that pes planus deformity on its own is not something that necessarily needs treatment but when it become symptomatic treatment is often beneficial.  First-line treatment includes stretching exercise as well as support with orthotics and shoe gear changes.  He has significant equinus and I recommended stretching exercises which she will begin.  We also discussed orthotic support.  I think these would be beneficial for him, however at this point he has limited range of motion of the subtalar joint and with some irregularity of the anterior process of calcaneus I would like to evaluate with an MRI as I have some concern that there is a tarsal coalition of the bilateral calcaneonavicular joint.  Does not appear to be completely osseous but could be a synchondrosis which is causing pain and limited range of motion and contribute to his pes planus deformity.  If there is no coalition then considering orthotic support is our first line treatment would be the best course, if this is not helpful then I think we should strongly consider surgical intervention.  Surgical I think he would need a gastrocnemius recession versus tendo Achilles lengthening, medial calcaneal slide, possible FDL transfer and Cotton versus Lapidus procedure to stabilize the first ray.  He will return after the MRI to discuss further treatment  Return in about 4 weeks (around 03/20/2021) for after MRI to review.

## 2021-02-20 NOTE — Patient Instructions (Signed)
Do exercises exactly as told by your health care provider and adjust them as directed. It is normal to feel mild stretching, pulling, tightness, or discomfort as you do these exercises. Stop the exercise right away if you feel sudden pain or your pain gets worse.   Stretching exercises These exercises improve the movement and flexibility of your calf muscles. These exercises may also help to relieve pain and stiffness. Standing gastroc stretch  This exercise is also called a standing calf (gastroc) stretch. 1. Stand with your hands against a wall. 2. Extend your left / right leg behind you, and bend your front knee slightly. Your heels should be on the floor. 3. Keeping your heels on the floor and your back knee straight, shift your weight toward the wall. You should feel a gentle stretch in the back of your lower leg (calf). 4. Hold this position for 10 seconds. Repeat 10 times. Complete this exercise 2 times a day. Gastroc and soleus stretch, standing This is an exercise in which you stand on a step and use your body weight to stretch your calf muscles. To do this exercise: 1. Stand with the ball of your left / right foot on a step. The ball of your foot is on the walking surface, right under your toes. 2. Keep your other foot firmly on the same step. 3. Hold on to the wall, a railing, or a chair for balance. 4. Slowly lift your other foot, allowing your body weight to press your left / right heel down over the edge of the step. You should feel a stretch in your left / right calf. 5. Hold this position for 10 seconds. 6. Return both feet to the step. 7. Repeat this exercise with a slight bend in your left / right knee. Repeat 10 times. Complete this exercise 2 times a day. Strengthening exercise This exercise builds strength and endurance in your foot muscles and may help to take pressure off your heel. Endurance is the ability to use your muscles for a long time, even after they get  tired. Arch lifts This exercise is sometimes called foot intrinsics. This is an exercise in which you lift the arch part of your foot only. To do this exercise: 1. Sit in a chair with your feet flat on the floor. 2. Keeping your big toe and your heel on the floor, lift only your arch, which is on the inner edge of your left / right foot. Do not move your knee or scrunch your toes. This is a small movement. 3. Hold this position for 10 seconds. 4. Return to the starting position. Repeat 10 times. Complete this exercise 2 times a day.  

## 2021-02-26 DIAGNOSIS — L720 Epidermal cyst: Secondary | ICD-10-CM | POA: Diagnosis not present

## 2021-03-05 ENCOUNTER — Ambulatory Visit
Admission: RE | Admit: 2021-03-05 | Discharge: 2021-03-05 | Disposition: A | Discharge status: Home / Self Care | Payer: BC Managed Care – PPO | Source: Ambulatory Visit | Attending: Podiatry | Admitting: Podiatry

## 2021-03-05 ENCOUNTER — Other Ambulatory Visit: Payer: Self-pay

## 2021-03-05 DIAGNOSIS — M25571 Pain in right ankle and joints of right foot: Secondary | ICD-10-CM

## 2021-03-05 DIAGNOSIS — Q6689 Other  specified congenital deformities of feet: Secondary | ICD-10-CM

## 2021-03-05 DIAGNOSIS — M25572 Pain in left ankle and joints of left foot: Secondary | ICD-10-CM | POA: Diagnosis not present

## 2021-03-05 DIAGNOSIS — D1779 Benign lipomatous neoplasm of other sites: Secondary | ICD-10-CM | POA: Diagnosis not present

## 2021-03-05 DIAGNOSIS — M19071 Primary osteoarthritis, right ankle and foot: Secondary | ICD-10-CM | POA: Diagnosis not present

## 2021-03-05 IMAGING — MR MR ANKLE*L* W/O CM
5 series · 39 of 40 positions shown · non-contrast
Comparison: Radiographs [DATE].

CLINICAL DATA: Bilateral lateral and plantar ankle pain for years.
No known injury or prior relevant surgery.

EXAM:
MRI OF THE LEFT ANKLE WITHOUT CONTRAST
TECHNIQUE: Multiplanar, multisequence MR imaging of the ankle was performed. No
intravenous contrast was administered.

[Series 4: T2 fat-sat · axial · 3.0mm · 0.50mm/px · z∈[-96,+33]mm · 8 of 34 slices shown (1 of 2)]
[im 1/34]
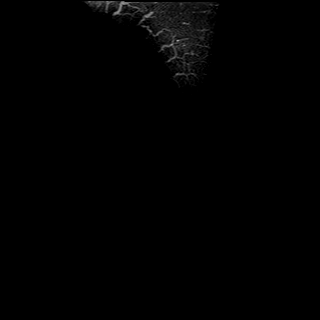
[im 5/34]
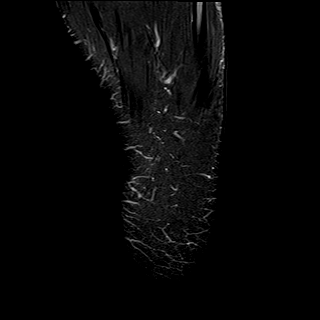
[im 10/34]
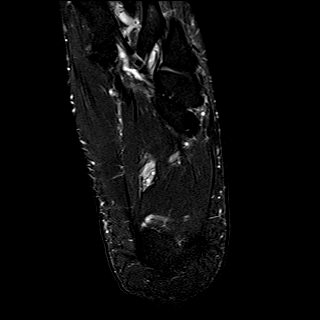
[im 15/34]
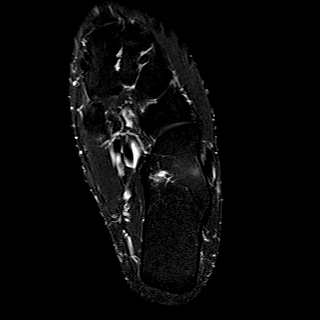
[im 19/34]
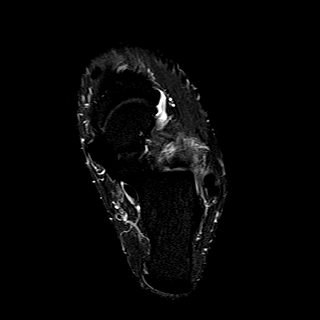
[im 24/34]
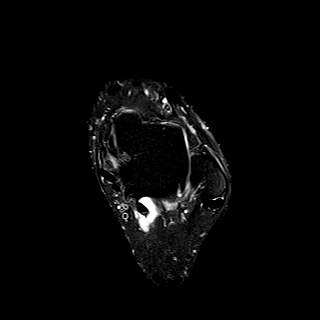
[im 29/34]
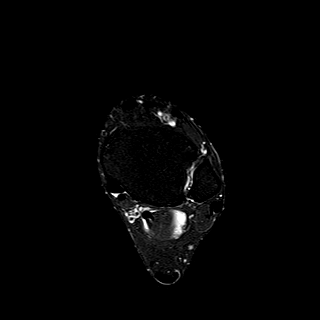
[im 34/34]
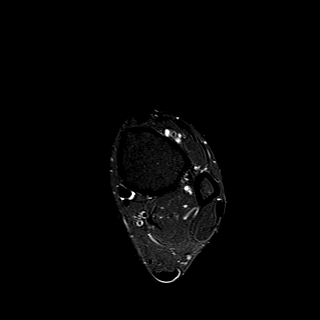

[Series 5: PD fat-sat · axial · 3.0mm · 0.50mm/px · z∈[-96,+33]mm · 9 of 34 slices shown]
[im 1/34]
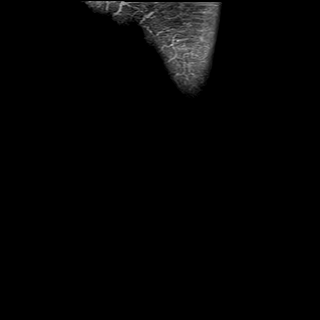
[im 5/34]
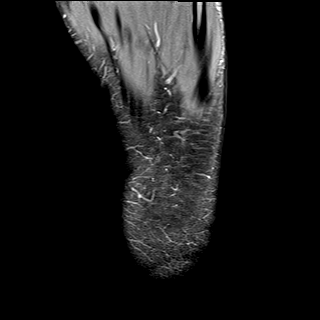
[im 9/34]
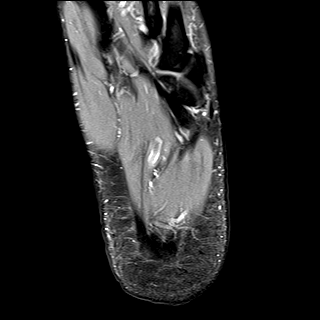
[im 13/34]
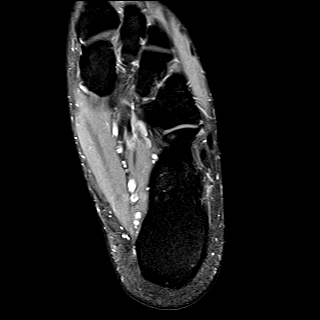
[im 17/34]
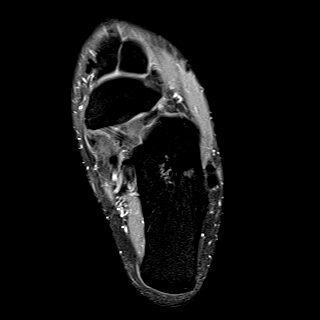
[im 21/34]
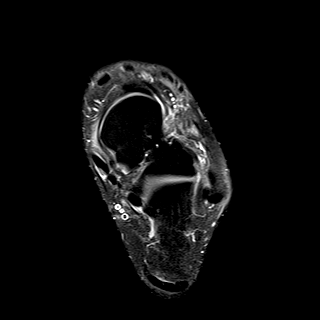
[im 25/34]
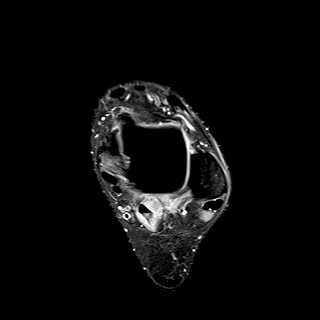
[im 29/34]
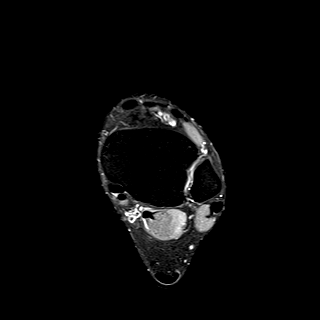
[im 34/34]
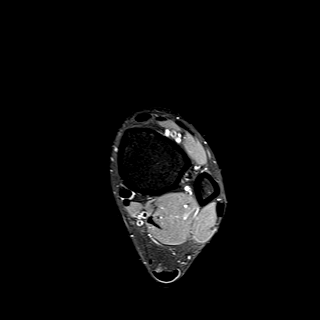

[Series 6: T1 · sagittal · 4.0mm · 0.56mm/px · 6 of 22 slices shown]
[im 1/22]
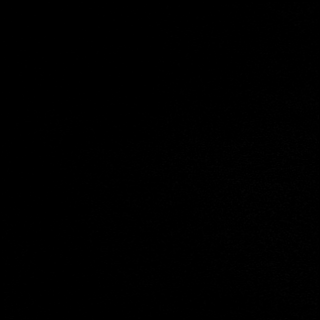
[im 5/22]
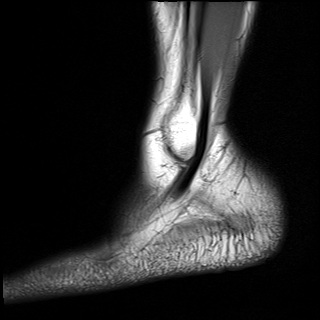
[im 9/22]
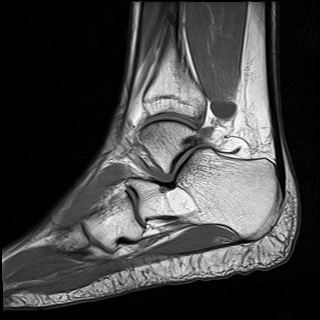
[im 13/22]
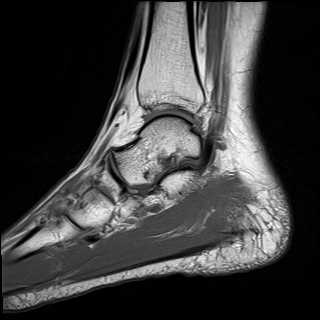
[im 17/22]
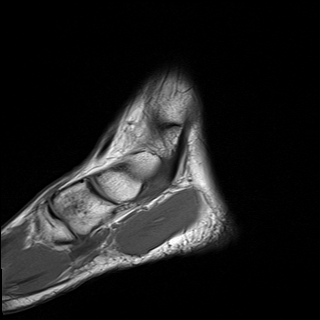
[im 22/22]
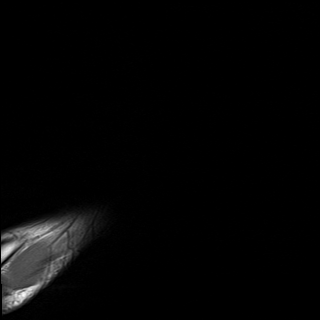

[Series 7: STIR · sagittal · 4.0mm · 0.35mm/px · 5 of 22 slices shown]
[im 1/22]
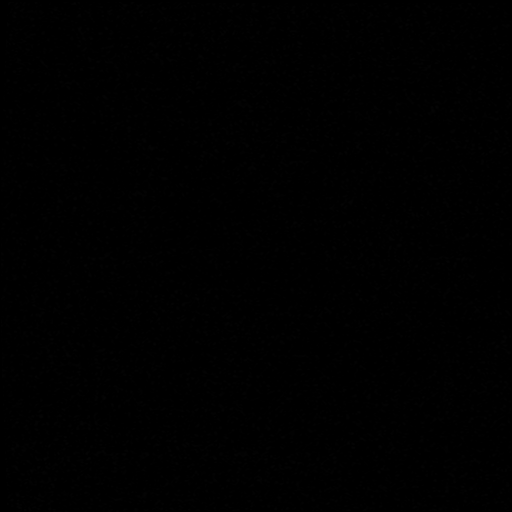
[im 5/22]
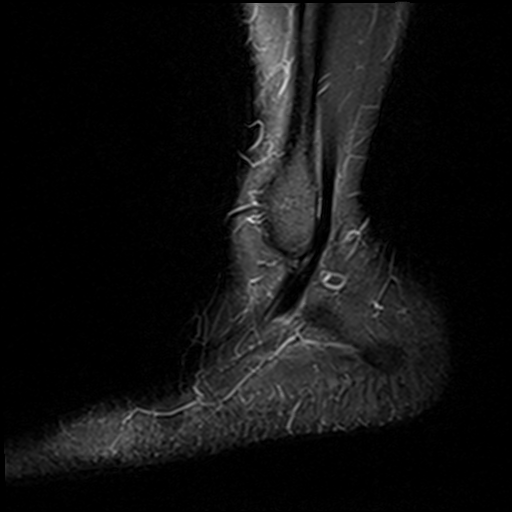
[im 9/22]
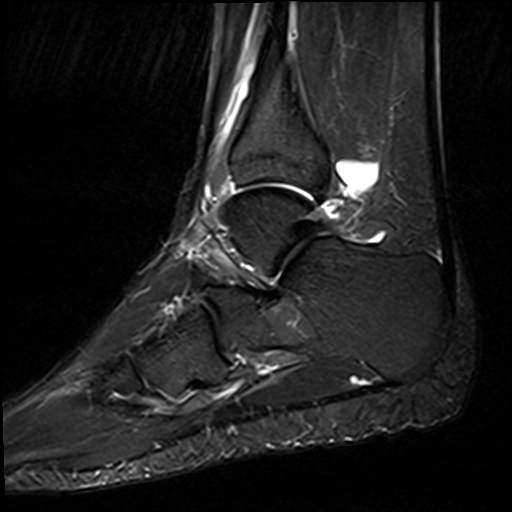
[im 13/22]
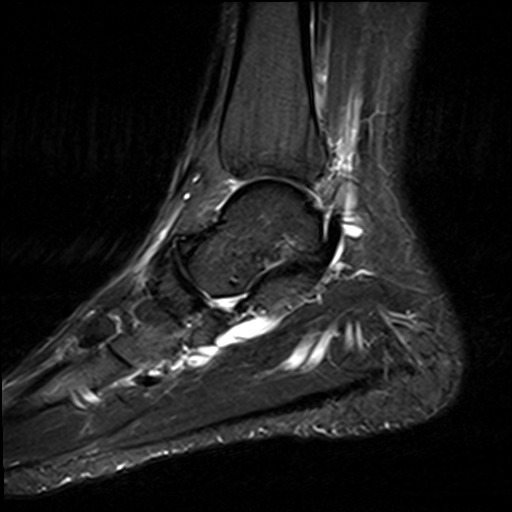
[im 17/22]
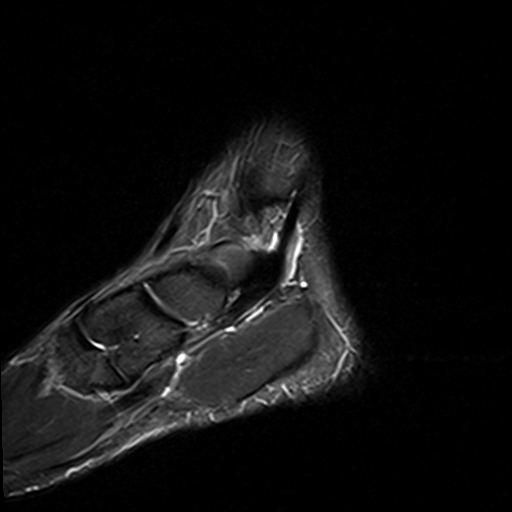

[Series 8: T2 fat-sat · coronal · 3.0mm · 0.50mm/px · 11 of 41 slices shown (2 of 2)]
[im 1/41]
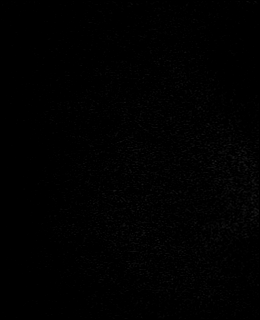
[im 5/41]
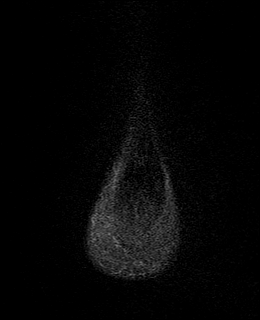
[im 9/41]
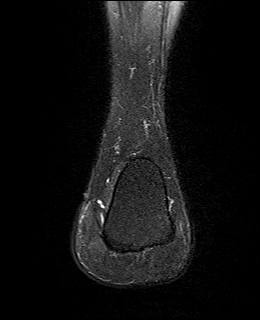
[im 13/41]
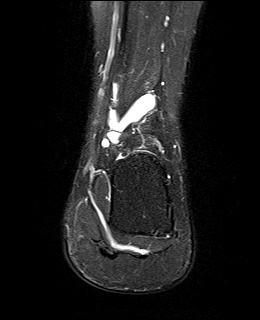
[im 17/41]
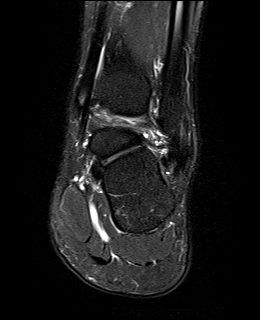
[im 21/41]
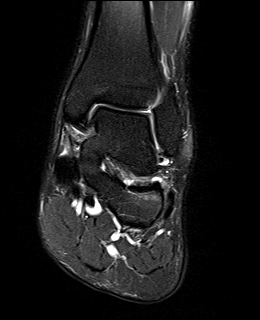
[im 25/41]
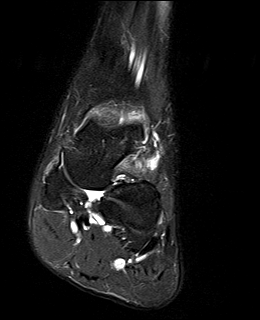
[im 29/41]
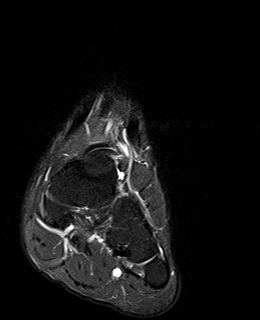
[im 33/41]
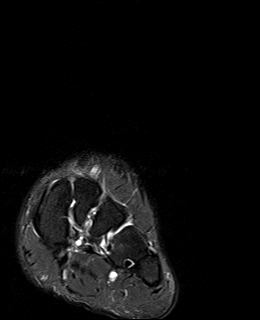
[im 37/41]
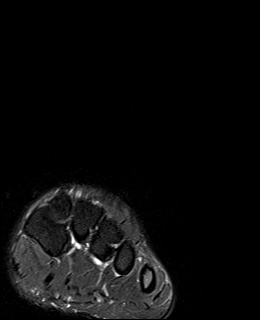
[im 41/41]
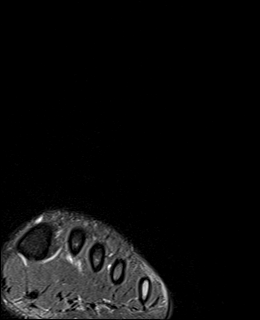

[39 of 40 positions shown; findings below may reference images not displayed]

FINDINGS: TENDONS

Peroneal: Intact and normally positioned.

Posteromedial: Intact and normally positioned. Prominent fluid
within the flexor hallucis longus tendon sheath, likely due to
communication with the joint. A small amount of fluid is present in
the posterior tibialis tendon sheath.

Anterior: Intact and normally positioned.

Achilles: Intact.

Plantar Fascia: Intact.

LIGAMENTS

Lateral: The anterior talofibular ligament is not well visualized
and may be chronically torn. The posterior talofibular and
calcaneofibular ligaments appear intact.

Medial: The deltoid and visualized portions of the spring ligament
appear intact.

CARTILAGE AND BONES

Ankle Joint: No significant ankle joint effusion. The talar dome and
tibial plafond are intact.

Subtalar Joints/Sinus Tarsi: Unremarkable.

Bones: Mild talonavicular spurring without evidence of tarsal
coalition. Underlying lipoma anteriorly in the calcaneus with fatty
and cystic components. No evidence of pathologic fracture.

Other: No significant soft tissue findings.
IMPRESSION: 1. Prominent fluid in the flexor hallucis longus and posterior
tibialis tendon sheaths. No evidence of tendon tear.
2. Possible chronic tear of the anterior talofibular ligament.
3. Talonavicular spurring without acute osseous findings or evidence
of tarsal coalition.

## 2021-03-05 IMAGING — MR MR ANKLE*R* W/O CM
5 series · 40 of 40 positions shown · non-contrast
Comparison: Foot radiographs [DATE]

CLINICAL DATA: Bilateral lateral and plantar ankle pain for years.
No known injury or prior relevant surgery.

EXAM:
MRI OF THE RIGHT ANKLE WITHOUT CONTRAST
TECHNIQUE: Multiplanar, multisequence MR imaging of the ankle was performed. No
intravenous contrast was administered.

[Series 4: T2 fat-sat · axial · 3.0mm · 0.50mm/px · z∈[-113,+16]mm · 10 of 34 slices shown (1 of 2)]
[im 1/34]
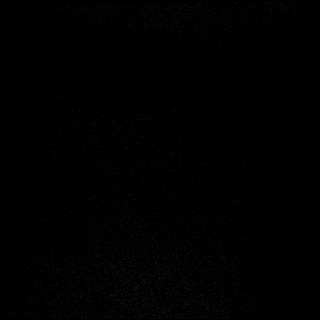
[im 4/34]
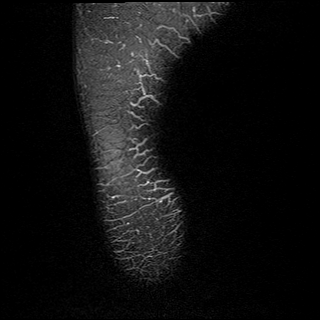
[im 8/34]
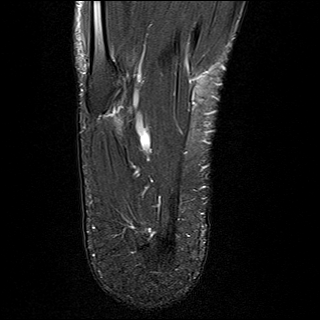
[im 12/34]
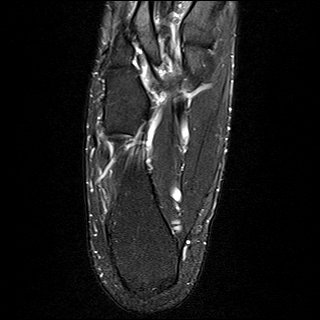
[im 15/34]
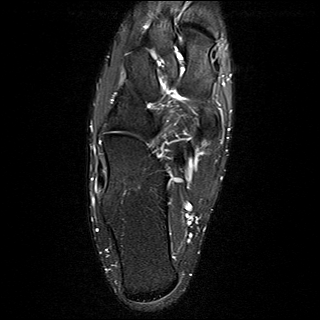
[im 19/34]
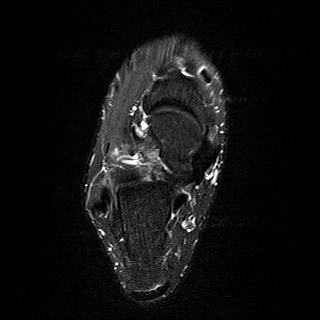
[im 23/34]
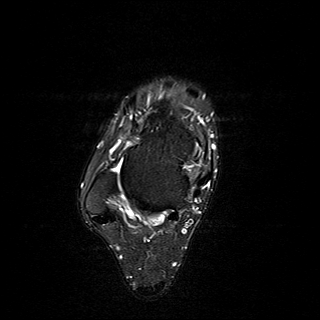
[im 26/34]
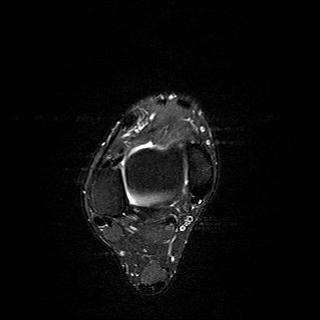
[im 30/34]
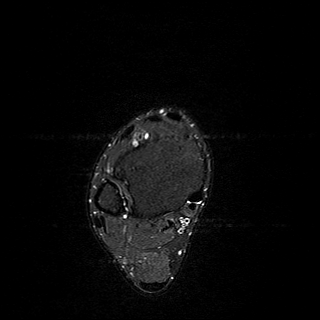
[im 34/34]
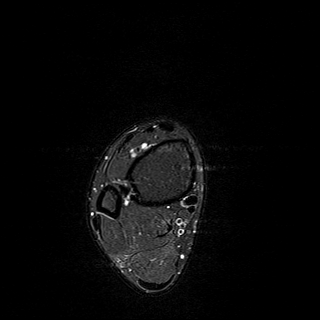

[Series 5: PD fat-sat · axial · 3.0mm · 0.50mm/px · z∈[-113,+16]mm · 9 of 34 slices shown]
[im 1/34]
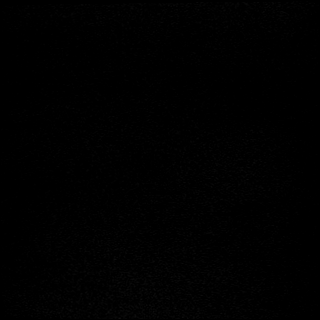
[im 5/34]
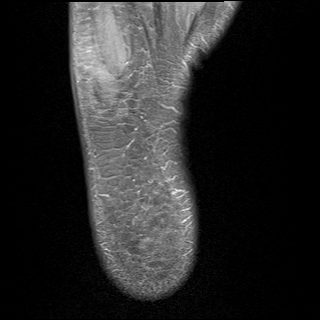
[im 9/34]
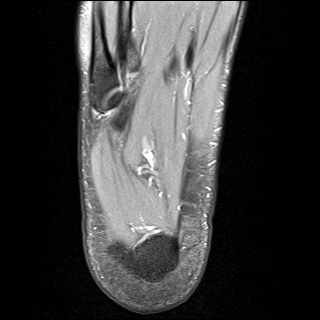
[im 13/34]
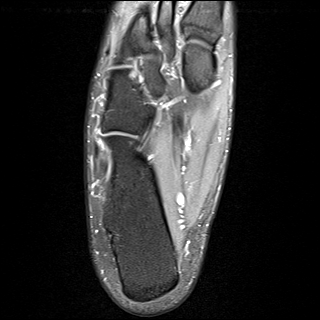
[im 17/34]
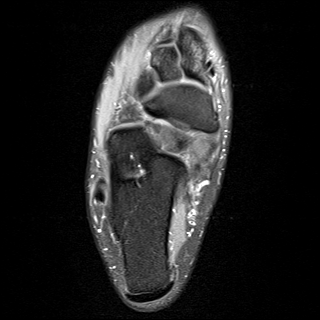
[im 21/34]
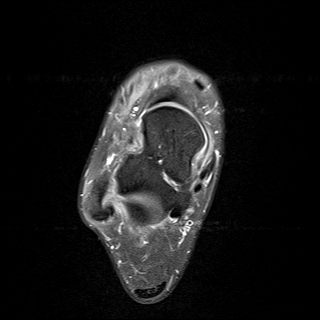
[im 25/34]
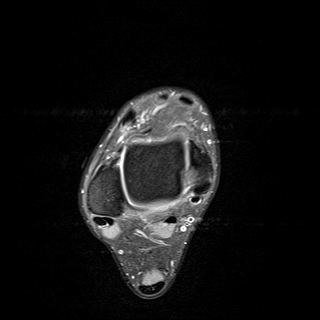
[im 29/34]
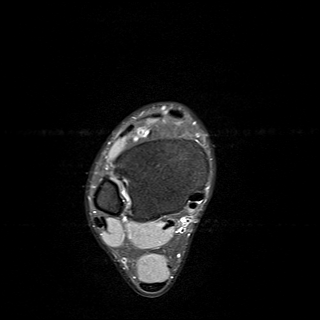
[im 34/34]
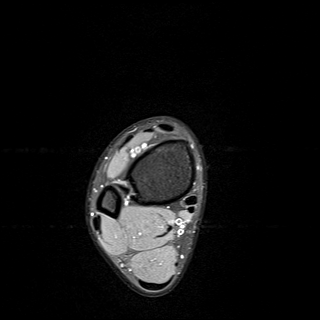

[Series 6: T1 · sagittal · 4.0mm · 0.56mm/px · 5 of 20 slices shown]
[im 1/20]
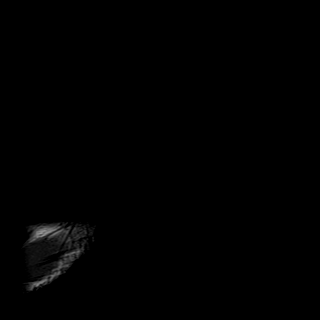
[im 5/20]
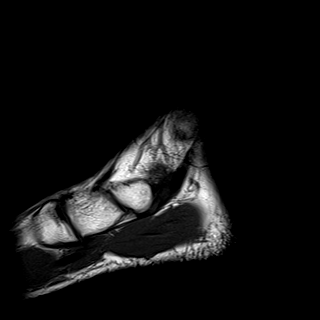
[im 10/20]
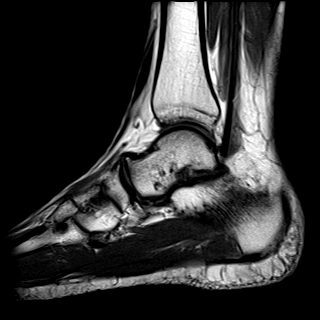
[im 15/20]
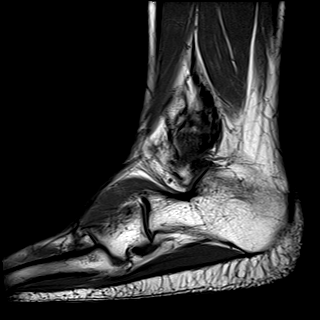
[im 20/20]
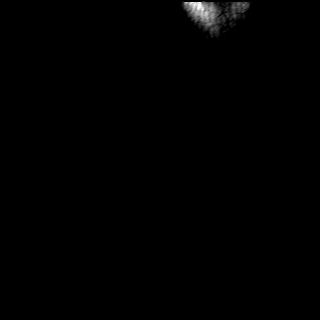

[Series 7: STIR · sagittal · 4.0mm · 0.35mm/px · 5 of 20 slices shown]
[im 1/20]
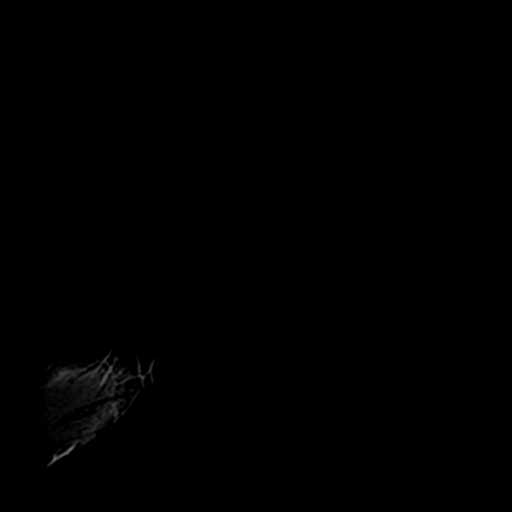
[im 5/20]
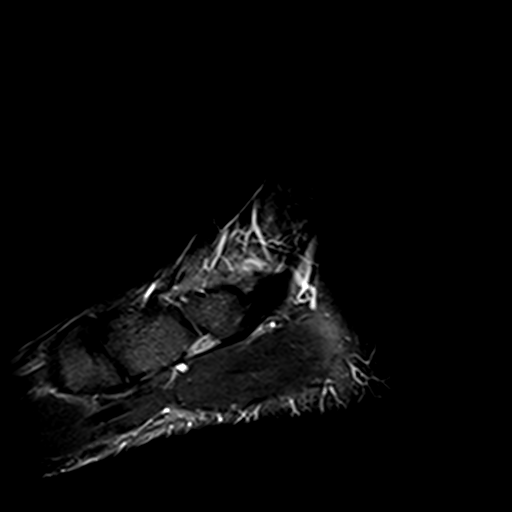
[im 10/20]
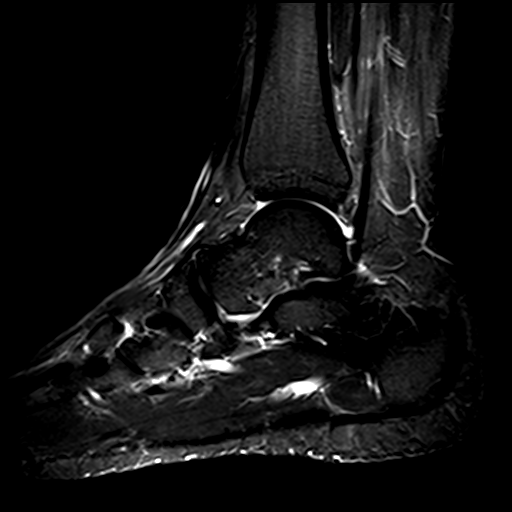
[im 15/20]
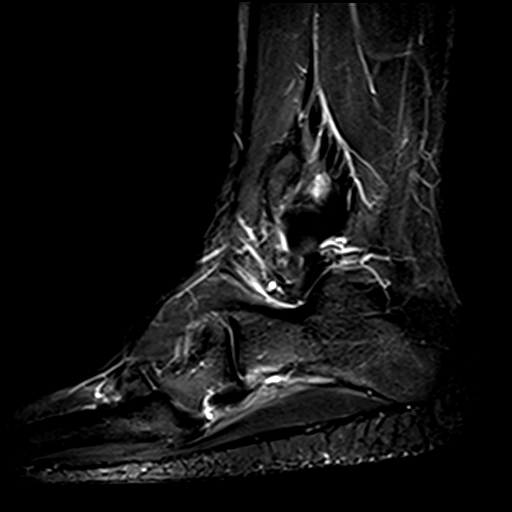
[im 20/20]
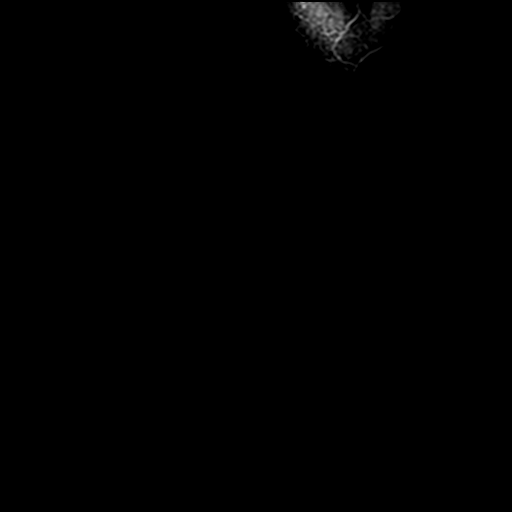

[Series 8: T2 fat-sat · coronal · 3.0mm · 0.50mm/px · 11 of 41 slices shown (2 of 2)]
[im 1/41]
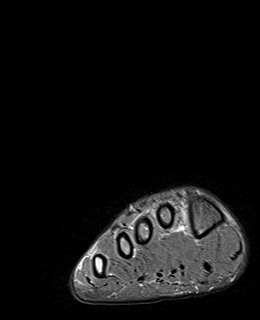
[im 5/41]
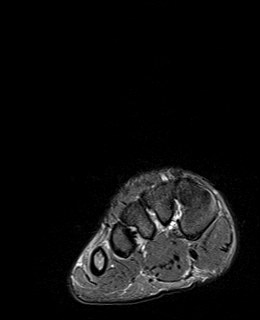
[im 9/41]
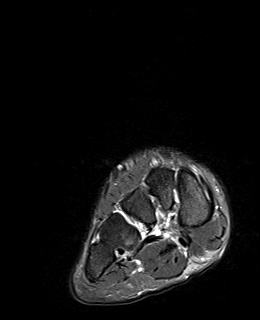
[im 13/41]
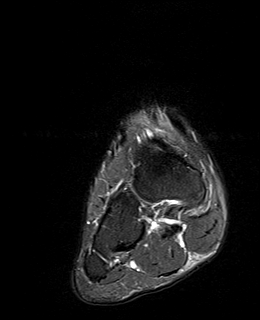
[im 17/41]
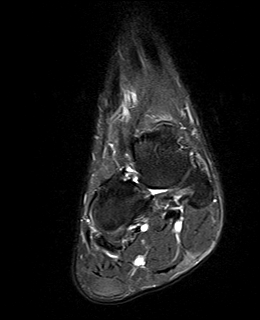
[im 21/41]
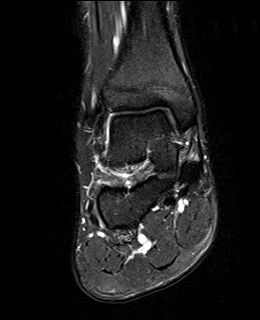
[im 25/41]
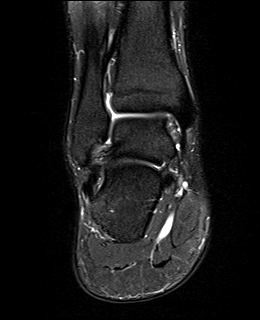
[im 29/41]
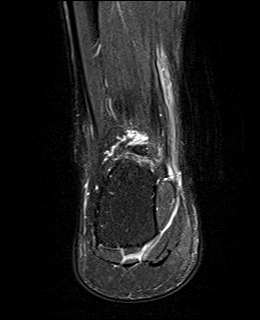
[im 33/41]
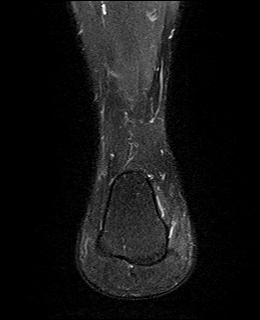
[im 37/41]
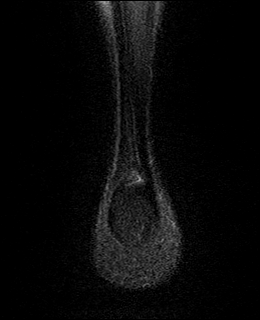
[im 41/41]
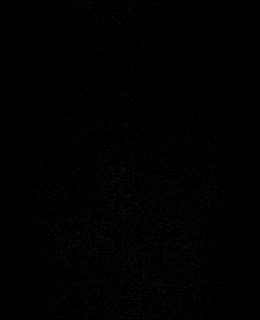

[40 of 40 positions shown; findings below may reference images not displayed]

FINDINGS: TENDONS

Peroneal: Intact and normally positioned. Possible mild
tenosynovitis of the peroneus longus tendon where it passes along
the inferolateral aspect of the calcaneus and cuboid.

Posteromedial: Intact and normally positioned.

Anterior: Intact and normally positioned.

Achilles: Intact.

Plantar Fascia: Intact.

LIGAMENTS

Lateral: The anterior and posterior talofibular and calcaneofibular
ligaments are intact.

Medial: The deltoid and visualized portions of the spring ligament
appear intact.

CARTILAGE AND BONES

Ankle Joint: No significant ankle joint effusion. The talar dome and
tibial plafond are intact.

Subtalar Joints/Sinus Tarsi: Unremarkable.

Bones: Mild talonavicular degenerative changes. No acute osseous
findings. No evidence of tarsal coalition.

Other: No significant soft tissue findings.
IMPRESSION: 1. Possible mild distal peroneus longus tenosynovitis without tear.
2. Mild talonavicular spurring.
3. No acute osseous or ligamentous findings.

## 2021-03-13 ENCOUNTER — Ambulatory Visit: Payer: BC Managed Care – PPO | Admitting: Podiatry

## 2021-03-19 ENCOUNTER — Encounter: Payer: Self-pay | Admitting: Podiatry

## 2021-03-19 ENCOUNTER — Ambulatory Visit: Payer: BC Managed Care – PPO | Admitting: Podiatry

## 2021-03-19 ENCOUNTER — Other Ambulatory Visit: Payer: Self-pay

## 2021-03-19 ENCOUNTER — Telehealth: Payer: Self-pay

## 2021-03-19 DIAGNOSIS — M2142 Flat foot [pes planus] (acquired), left foot: Secondary | ICD-10-CM

## 2021-03-19 DIAGNOSIS — M216X1 Other acquired deformities of right foot: Secondary | ICD-10-CM | POA: Diagnosis not present

## 2021-03-19 DIAGNOSIS — M21862 Other specified acquired deformities of left lower leg: Secondary | ICD-10-CM

## 2021-03-19 DIAGNOSIS — M25572 Pain in left ankle and joints of left foot: Secondary | ICD-10-CM

## 2021-03-19 DIAGNOSIS — M216X2 Other acquired deformities of left foot: Secondary | ICD-10-CM

## 2021-03-19 DIAGNOSIS — M2141 Flat foot [pes planus] (acquired), right foot: Secondary | ICD-10-CM | POA: Diagnosis not present

## 2021-03-19 DIAGNOSIS — M21279 Flexion deformity, unspecified ankle and toes: Secondary | ICD-10-CM | POA: Diagnosis not present

## 2021-03-19 DIAGNOSIS — M21861 Other specified acquired deformities of right lower leg: Secondary | ICD-10-CM

## 2021-03-19 DIAGNOSIS — M25571 Pain in right ankle and joints of right foot: Secondary | ICD-10-CM

## 2021-03-19 NOTE — Telephone Encounter (Signed)
Patient molded for orthotics today for pes planus. Financial consent signed. Insurance form given to patient to check on coverage.

## 2021-03-19 NOTE — Progress Notes (Signed)
Subjective:  Patient ID: Rodney Austin, male    DOB: 08-May-1991,  MRN: [id number]  Chief Complaint  Patient presents with  . Flat Foot      MRI Results     30 y.o. male presents with the above complaint. History confirmed with patient.  Feel about the same has been doing the stretches and completed the MRI  Objective:  Physical Exam: warm, good capillary refill, no trophic changes or ulcerative lesions, normal DP and PT pulses and normal sensory exam.  Bilaterally he has pes planovalgus with pain on palpation of the sinus tarsi and lateral column, pain along the posterior tibial tendon.  Collapse of the medial arch with heel valgus on weightbearing.  Able to do single and double heel rises.  Gastrium equinus with a positive Silfverskiold test.  He has hammertoes and mild hallux valgus deformity bilaterally.  Subtalar joint range of motion is limited and painful bilaterally     Radiographs: X-ray of both feet: Pes planus deformity with spurring at the talonavicular and subtalar joint.  Metatarsus primus elevatus is noted.  He has calcaneal valgus with mild forefoot abduction.  On the oblique view there is some irregularity of the anterior process of calcaneus, no gross osseous coalition is noted  Study Result  Narrative & Impression  CLINICAL DATA:  Bilateral lateral and plantar ankle pain for years. No known injury or prior relevant surgery.  EXAM: MRI OF THE LEFT ANKLE WITHOUT CONTRAST  TECHNIQUE: Multiplanar, multisequence MR imaging of the ankle was performed. No intravenous contrast was administered.  COMPARISON:  Radiographs 02/20/2021.  FINDINGS: TENDONS  Peroneal: Intact and normally positioned.  Posteromedial: Intact and normally positioned. Prominent fluid within the flexor hallucis longus tendon sheath, likely due to communication with the joint. A small amount of fluid is present in the posterior tibialis tendon sheath.  Anterior: Intact and normally  positioned.  Achilles: Intact.  Plantar Fascia: Intact.  LIGAMENTS  Lateral: The anterior talofibular ligament is not well visualized and may be chronically torn. The posterior talofibular and calcaneofibular ligaments appear intact.  Medial: The deltoid and visualized portions of the spring ligament appear intact.  CARTILAGE AND BONES  Ankle Joint: No significant ankle joint effusion. The talar dome and tibial plafond are intact.  Subtalar Joints/Sinus Tarsi: Unremarkable.  Bones: Mild talonavicular spurring without evidence of tarsal coalition. Underlying lipoma anteriorly in the calcaneus with fatty and cystic components. No evidence of pathologic fracture.  Other: No significant soft tissue findings.  IMPRESSION: 1. Prominent fluid in the flexor hallucis longus and posterior tibialis tendon sheaths. No evidence of tendon tear. 2. 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 3. Talonavicular spurring without acute osseous findings or evidence of tarsal coalition.   Electronically Signed   By: Carey Bullocks M.D.   On: 03/05/2021 16:45   Study Result  Narrative & Impression  CLINICAL DATA:  Bilateral lateral and plantar ankle pain for years. No known injury or prior relevant surgery.  EXAM: MRI OF THE RIGHT ANKLE WITHOUT CONTRAST  TECHNIQUE: Multiplanar, multisequence MR imaging of the ankle was performed. No intravenous contrast was administered.  COMPARISON:  Foot radiographs 02/20/2021  FINDINGS: TENDONS  Peroneal: Intact and normally positioned. Possible mild tenosynovitis of the peroneus longus tendon where it passes along the inferolateral aspect of the calcaneus and cuboid.  Posteromedial: Intact and normally positioned.  Anterior: Intact and normally positioned.  Achilles: Intact.  Plantar Fascia: Intact.  LIGAMENTS  Lateral: The anterior and posterior talofibular and calcaneofibular ligaments  are intact.  Medial: The deltoid and visualized portions of the spring ligament appear intact.  CARTILAGE AND BONES  Ankle Joint: No significant ankle joint effusion. The talar dome and tibial plafond are intact.  Subtalar Joints/Sinus Tarsi: Unremarkable.  Bones: Mild talonavicular degenerative changes. No acute osseous findings. No evidence of tarsal coalition.  Other: No significant soft tissue findings.  IMPRESSION: 1. Possible mild distal peroneus longus tenosynovitis without tear. 2. Mild talonavicular spurring. 3. No acute osseous or ligamentous findings.   Electronically Signed   By: Carey Bullocks M.D.   On: 03/05/2021 16:40    Assessment:   1. Pes planus of both feet   2. Gastrocnemius equinus of left lower extremity   3. Gastrocnemius equinus of right lower extremity   4. 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  Reviewed the MRI findings and discussed with him that there is currently no radiographic evidence of tarsal coalition with advanced imaging so glad that we got this to rule this out.  Discussed with him that for now would recommend conservative treatment with orthotic support and stretching exercises which was discussed last time.  He was casted today for custom molded orthoses with a deep heel cup and longitudinal arch support semirigid in nature and with rear foot and forefoot medial posting.  We will see him back in 3 months after he has had time to break in his orthotics and see if any adjustments are necessary  Return in about 3 months (around 06/18/2021) for re-check flat feet and orthotics.

## 2021-03-20 ENCOUNTER — Ambulatory Visit: Payer: BC Managed Care – PPO | Admitting: Podiatry

## 2021-04-02 DIAGNOSIS — L720 Epidermal cyst: Secondary | ICD-10-CM | POA: Diagnosis not present

## 2021-04-04 ENCOUNTER — Telehealth: Payer: Self-pay | Admitting: Podiatry

## 2021-04-04 NOTE — Telephone Encounter (Signed)
Orthotics in.. lvm for pt to call to schedule an appt to pick them up. °

## 2021-04-10 ENCOUNTER — Other Ambulatory Visit: Payer: Self-pay

## 2021-04-10 ENCOUNTER — Ambulatory Visit (INDEPENDENT_AMBULATORY_CARE_PROVIDER_SITE_OTHER): Payer: BC Managed Care – PPO | Admitting: *Deleted

## 2021-04-10 DIAGNOSIS — M216X1 Other acquired deformities of right foot: Secondary | ICD-10-CM

## 2021-04-10 DIAGNOSIS — M21862 Other specified acquired deformities of left lower leg: Secondary | ICD-10-CM

## 2021-04-10 DIAGNOSIS — M216X2 Other acquired deformities of left foot: Secondary | ICD-10-CM

## 2021-04-10 DIAGNOSIS — M2142 Flat foot [pes planus] (acquired), left foot: Secondary | ICD-10-CM

## 2021-04-10 DIAGNOSIS — M2141 Flat foot [pes planus] (acquired), right foot: Secondary | ICD-10-CM

## 2021-04-10 DIAGNOSIS — M21861 Other specified acquired deformities of right lower leg: Secondary | ICD-10-CM

## 2021-04-10 NOTE — Progress Notes (Signed)
Patient presents today to pick up custom molded foot orthotics, diagnosed with pes planus with gastroc equinus by Dr. Lilian Kapur.   Orthotics were dispensed and fit was satisfactory. Reviewed instructions for break-in and wear. Written instructions given to patient.  Patient will follow up as needed.   [name] - order # [id number]

## 2021-06-19 ENCOUNTER — Other Ambulatory Visit: Payer: Self-pay

## 2021-06-19 ENCOUNTER — Ambulatory Visit: Payer: BC Managed Care – PPO | Admitting: Podiatry

## 2021-06-19 ENCOUNTER — Telehealth: Payer: Self-pay

## 2021-06-19 DIAGNOSIS — M2142 Flat foot [pes planus] (acquired), left foot: Secondary | ICD-10-CM

## 2021-06-19 DIAGNOSIS — M2141 Flat foot [pes planus] (acquired), right foot: Secondary | ICD-10-CM

## 2021-06-19 DIAGNOSIS — M21862 Other specified acquired deformities of left lower leg: Secondary | ICD-10-CM

## 2021-06-19 DIAGNOSIS — M7671 Peroneal tendinitis, right leg: Secondary | ICD-10-CM

## 2021-06-19 DIAGNOSIS — M7672 Peroneal tendinitis, left leg: Secondary | ICD-10-CM

## 2021-06-19 DIAGNOSIS — M216X1 Other acquired deformities of right foot: Secondary | ICD-10-CM | POA: Diagnosis not present

## 2021-06-19 DIAGNOSIS — M216X2 Other acquired deformities of left foot: Secondary | ICD-10-CM

## 2021-06-19 DIAGNOSIS — M21861 Other specified acquired deformities of right lower leg: Secondary | ICD-10-CM

## 2021-06-19 NOTE — Telephone Encounter (Signed)
Demographics and orders for PT faxed to [facility]  Evaluate and treat M76.61 x 2 Bilateral peroneal tendonitis Bilateral Equinus M21.61 x 2  Twice weekly for 6 to 8 weeks

## 2021-06-20 NOTE — Progress Notes (Signed)
Subjective:  Patient ID: Rodney Austin, male    DOB: November 18, 1991,  MRN: [id number]  Chief Complaint  Patient presents with   Flat Foot    MRI Results,for re-check flat feet and orthotics    30 y.o. male returns for follow-up with the above complaint. History confirmed with patient.  Orthotics have been comfortable and has offered some relief  Objective:  Physical Exam: warm, good capillary refill, no trophic changes or ulcerative lesions, normal DP and PT pulses and normal sensory exam.  Today no pain on the posterior tibial tendon or in the sinus tarsi, he does have pain at the insertion of the peroneus brevis on the fifth metatarsal base bilateral.  Collapse of the medial arch with heel valgus on weightbearing.  Able to do single and double heel rises.  Gastrium equinus with a positive Silfverskiold test.  He has hammertoes and mild hallux valgus deformity bilaterally.       Radiographs: X-ray of both feet: Pes planus deformity with spurring at the talonavicular and subtalar joint.  Metatarsus primus elevatus is noted.  He has calcaneal valgus with mild forefoot abduction.  On the oblique view there is some irregularity of the anterior process of calcaneus, no gross osseous coalition is noted  Study Result  Narrative & Impression  CLINICAL DATA:  Bilateral lateral and plantar ankle pain for years. No known injury or prior relevant surgery.   EXAM: MRI OF THE LEFT ANKLE WITHOUT CONTRAST   TECHNIQUE: Multiplanar, multisequence MR imaging of the ankle was performed. No intravenous contrast was administered.   COMPARISON:  Radiographs 02/20/2021.   FINDINGS: TENDONS   Peroneal: Intact and normally positioned.   Posteromedial: Intact and normally positioned. Prominent fluid within the flexor hallucis longus tendon sheath, likely due to communication with the joint. A small amount of fluid is present in the posterior tibialis tendon sheath.   Anterior: Intact and normally  positioned.   Achilles: Intact.   Plantar Fascia: Intact.   LIGAMENTS   Lateral: The anterior talofibular ligament is not well visualized and may be chronically torn. The posterior talofibular and calcaneofibular ligaments appear intact.   Medial: The deltoid and visualized portions of the spring ligament appear intact.   CARTILAGE AND BONES   Ankle Joint: No significant ankle joint effusion. The talar dome and tibial plafond are intact.   Subtalar Joints/Sinus Tarsi: Unremarkable.   Bones: Mild talonavicular spurring without evidence of tarsal coalition. Underlying lipoma anteriorly in the calcaneus with fatty and cystic components. No evidence of pathologic fracture.   Other: No significant soft tissue findings.   IMPRESSION: 1. Prominent fluid in the flexor hallucis longus and posterior tibialis tendon sheaths. No evidence of tendon tear. 2. 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 3. Talonavicular spurring without acute osseous findings or evidence of tarsal coalition.     Electronically Signed   By: Richardean Sale M.D.   On: 03/05/2021 16:45   Study Result  Narrative & Impression  CLINICAL DATA:  Bilateral lateral and plantar ankle pain for years. No known injury or prior relevant surgery.   EXAM: MRI OF THE RIGHT ANKLE WITHOUT CONTRAST   TECHNIQUE: Multiplanar, multisequence MR imaging of the ankle was performed. No intravenous contrast was administered.   COMPARISON:  Foot radiographs 02/20/2021   FINDINGS: TENDONS   Peroneal: Intact and normally positioned. Possible mild tenosynovitis of the peroneus longus tendon where it passes along the inferolateral aspect of the calcaneus and cuboid.   Posteromedial: Intact and normally positioned.  Anterior: Intact and normally positioned.   Achilles: Intact.   Plantar Fascia: Intact.   LIGAMENTS   Lateral: The anterior and posterior talofibular and calcaneofibular ligaments  are intact.   Medial: The deltoid and visualized portions of the spring ligament appear intact.   CARTILAGE AND BONES   Ankle Joint: No significant ankle joint effusion. The talar dome and tibial plafond are intact.   Subtalar Joints/Sinus Tarsi: Unremarkable.   Bones: Mild talonavicular degenerative changes. No acute osseous findings. No evidence of tarsal coalition.   Other: No significant soft tissue findings.   IMPRESSION: 1. Possible mild distal peroneus longus tenosynovitis without tear. 2. Mild talonavicular spurring. 3. No acute osseous or ligamentous findings.     Electronically Signed   By: Richardean Sale M.D.   On: 03/05/2021 16:40    Assessment:   1. Peroneal tendinitis of both lower legs   2. Pes planus of both feet   3. Gastrocnemius equinus of left lower extremity   4. Gastrocnemius equinus of right lower extremity      Plan:  Patient was evaluated and treated and all questions answered.  Overall is had some improvement with orthotic support.  Most of his pain is centered about the peroneal insertions on the peroneus brevis at the fifth met bases currently.  I recommended physical therapy for peroneal tendinitis and strengthening conditioning.  Referral sent to [facility] in [place].  He will return in 3 months for follow-up or sooner if he has issues  No follow-ups on file.

## 2021-08-06 DIAGNOSIS — Z13220 Encounter for screening for lipoid disorders: Secondary | ICD-10-CM | POA: Diagnosis not present

## 2021-08-06 DIAGNOSIS — Z0000 Encounter for general adult medical examination without abnormal findings: Secondary | ICD-10-CM | POA: Diagnosis not present

## 2021-08-06 DIAGNOSIS — M25541 Pain in joints of right hand: Secondary | ICD-10-CM | POA: Diagnosis not present

## 2021-08-06 DIAGNOSIS — M25572 Pain in left ankle and joints of left foot: Secondary | ICD-10-CM | POA: Diagnosis not present

## 2021-08-06 DIAGNOSIS — M25561 Pain in right knee: Secondary | ICD-10-CM | POA: Diagnosis not present

## 2021-08-06 DIAGNOSIS — M25571 Pain in right ankle and joints of right foot: Secondary | ICD-10-CM | POA: Diagnosis not present

## 2021-08-06 DIAGNOSIS — M25562 Pain in left knee: Secondary | ICD-10-CM | POA: Diagnosis not present

## 2021-08-06 DIAGNOSIS — M25542 Pain in joints of left hand: Secondary | ICD-10-CM | POA: Diagnosis not present

## 2021-08-14 DIAGNOSIS — M25562 Pain in left knee: Secondary | ICD-10-CM | POA: Diagnosis not present

## 2021-08-14 DIAGNOSIS — M25561 Pain in right knee: Secondary | ICD-10-CM | POA: Diagnosis not present

## 2021-08-14 DIAGNOSIS — M25572 Pain in left ankle and joints of left foot: Secondary | ICD-10-CM | POA: Diagnosis not present

## 2021-08-14 DIAGNOSIS — M25571 Pain in right ankle and joints of right foot: Secondary | ICD-10-CM | POA: Diagnosis not present

## 2021-08-17 DIAGNOSIS — M25561 Pain in right knee: Secondary | ICD-10-CM | POA: Diagnosis not present

## 2021-08-17 DIAGNOSIS — M25571 Pain in right ankle and joints of right foot: Secondary | ICD-10-CM | POA: Diagnosis not present

## 2021-08-17 DIAGNOSIS — M25562 Pain in left knee: Secondary | ICD-10-CM | POA: Diagnosis not present

## 2021-08-17 DIAGNOSIS — M25572 Pain in left ankle and joints of left foot: Secondary | ICD-10-CM | POA: Diagnosis not present

## 2021-08-23 DIAGNOSIS — M25561 Pain in right knee: Secondary | ICD-10-CM | POA: Diagnosis not present

## 2021-08-23 DIAGNOSIS — M25562 Pain in left knee: Secondary | ICD-10-CM | POA: Diagnosis not present

## 2021-08-23 DIAGNOSIS — M25572 Pain in left ankle and joints of left foot: Secondary | ICD-10-CM | POA: Diagnosis not present

## 2021-08-23 DIAGNOSIS — M25571 Pain in right ankle and joints of right foot: Secondary | ICD-10-CM | POA: Diagnosis not present

## 2021-08-27 DIAGNOSIS — M25561 Pain in right knee: Secondary | ICD-10-CM | POA: Diagnosis not present

## 2021-08-27 DIAGNOSIS — M25572 Pain in left ankle and joints of left foot: Secondary | ICD-10-CM | POA: Diagnosis not present

## 2021-08-27 DIAGNOSIS — M25571 Pain in right ankle and joints of right foot: Secondary | ICD-10-CM | POA: Diagnosis not present

## 2021-08-27 DIAGNOSIS — M25562 Pain in left knee: Secondary | ICD-10-CM | POA: Diagnosis not present

## 2021-09-03 DIAGNOSIS — M25571 Pain in right ankle and joints of right foot: Secondary | ICD-10-CM | POA: Diagnosis not present

## 2021-09-03 DIAGNOSIS — M25562 Pain in left knee: Secondary | ICD-10-CM | POA: Diagnosis not present

## 2021-09-03 DIAGNOSIS — M25561 Pain in right knee: Secondary | ICD-10-CM | POA: Diagnosis not present

## 2021-09-03 DIAGNOSIS — M25572 Pain in left ankle and joints of left foot: Secondary | ICD-10-CM | POA: Diagnosis not present

## 2021-09-07 DIAGNOSIS — L910 Hypertrophic scar: Secondary | ICD-10-CM | POA: Diagnosis not present

## 2021-09-10 DIAGNOSIS — M25562 Pain in left knee: Secondary | ICD-10-CM | POA: Diagnosis not present

## 2021-09-10 DIAGNOSIS — M25571 Pain in right ankle and joints of right foot: Secondary | ICD-10-CM | POA: Diagnosis not present

## 2021-09-10 DIAGNOSIS — M25572 Pain in left ankle and joints of left foot: Secondary | ICD-10-CM | POA: Diagnosis not present

## 2021-09-10 DIAGNOSIS — M25561 Pain in right knee: Secondary | ICD-10-CM | POA: Diagnosis not present

## 2021-09-17 DIAGNOSIS — M25562 Pain in left knee: Secondary | ICD-10-CM | POA: Diagnosis not present

## 2021-09-17 DIAGNOSIS — M25572 Pain in left ankle and joints of left foot: Secondary | ICD-10-CM | POA: Diagnosis not present

## 2021-09-17 DIAGNOSIS — M25571 Pain in right ankle and joints of right foot: Secondary | ICD-10-CM | POA: Diagnosis not present

## 2021-09-17 DIAGNOSIS — M25561 Pain in right knee: Secondary | ICD-10-CM | POA: Diagnosis not present

## 2021-09-20 ENCOUNTER — Ambulatory Visit: Payer: BC Managed Care – PPO | Admitting: Podiatry

## 2021-09-20 ENCOUNTER — Other Ambulatory Visit: Payer: Self-pay

## 2021-09-20 DIAGNOSIS — M25572 Pain in left ankle and joints of left foot: Secondary | ICD-10-CM | POA: Diagnosis not present

## 2021-09-20 DIAGNOSIS — M7672 Peroneal tendinitis, left leg: Secondary | ICD-10-CM

## 2021-09-20 DIAGNOSIS — M25571 Pain in right ankle and joints of right foot: Secondary | ICD-10-CM | POA: Diagnosis not present

## 2021-09-20 DIAGNOSIS — M7671 Peroneal tendinitis, right leg: Secondary | ICD-10-CM | POA: Diagnosis not present

## 2021-09-20 DIAGNOSIS — M25561 Pain in right knee: Secondary | ICD-10-CM | POA: Diagnosis not present

## 2021-09-20 DIAGNOSIS — M25562 Pain in left knee: Secondary | ICD-10-CM | POA: Diagnosis not present

## 2021-09-20 DIAGNOSIS — H52229 Regular astigmatism, unspecified eye: Secondary | ICD-10-CM | POA: Insufficient documentation

## 2021-09-20 NOTE — Patient Instructions (Signed)
We talked about 2 further options for the tendons:  Platelet rich plasma injection or steroid injection  For the steroid injection I would have to put you in a boot for 3-4 weeks after  If we do the platelet rich plasma injection it will not require a boot  Both are good options for reducing inflammation in the tendons

## 2021-09-20 NOTE — Progress Notes (Signed)
Subjective:  Patient ID: Rodney Austin, male    DOB: 1991-07-30,  MRN: [id number]  Chief Complaint  Patient presents with   Tendonitis    Bilateral, 3 month follow up    30 y.o. male returns for follow-up with the above complaint. History confirmed with patient.  Pain is up-and-down good days and bad days.  Physical therapy has been helpful  Objective:  Physical Exam: warm, good capillary refill, no trophic changes or ulcerative lesions, normal DP and PT pulses and normal sensory exam.  Today no pain on the posterior tibial tendon or in the sinus tarsi, he does have pain at the insertion of the peroneus brevis on the fifth metatarsal base bilateral and just distal to the malleolus.  Collapse of the medial arch with heel valgus on weightbearing.  Able to do single and double heel rises.  Gastrocnemius equinus with a positive Silfverskiold test.  He has hammertoes and mild hallux valgus deformity bilaterally.       Radiographs: X-ray of both feet: Pes planus deformity with spurring at the talonavicular and subtalar joint.  Metatarsus primus elevatus is noted.  He has calcaneal valgus with mild forefoot abduction.  On the oblique view there is some irregularity of the anterior process of calcaneus, no gross osseous coalition is noted  Study Result  Narrative & Impression  CLINICAL DATA:  Bilateral lateral and plantar ankle pain for years. No known injury or prior relevant surgery.   EXAM: MRI OF THE LEFT ANKLE WITHOUT CONTRAST   TECHNIQUE: Multiplanar, multisequence MR imaging of the ankle was performed. No intravenous contrast was administered.   COMPARISON:  Radiographs 02/20/2021.   FINDINGS: TENDONS   Peroneal: Intact and normally positioned.   Posteromedial: Intact and normally positioned. Prominent fluid within the flexor hallucis longus tendon sheath, likely due to communication with the joint. A small amount of fluid is present in the posterior tibialis tendon  sheath.   Anterior: Intact and normally positioned.   Achilles: Intact.   Plantar Fascia: Intact.   LIGAMENTS   Lateral: The anterior talofibular ligament is not well visualized and may be chronically torn. The posterior talofibular and calcaneofibular ligaments appear intact.   Medial: The deltoid and visualized portions of the spring ligament appear intact.   CARTILAGE AND BONES   Ankle Joint: No significant ankle joint effusion. The talar dome and tibial plafond are intact.   Subtalar Joints/Sinus Tarsi: Unremarkable.   Bones: Mild talonavicular spurring without evidence of tarsal coalition. Underlying lipoma anteriorly in the calcaneus with fatty and cystic components. No evidence of pathologic fracture.   Other: No significant soft tissue findings.   IMPRESSION: 1. Prominent fluid in the flexor hallucis longus and posterior tibialis tendon sheaths. No evidence of tendon tear. 2. 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 3. Talonavicular spurring without acute osseous findings or evidence of tarsal coalition.     Electronically Signed   By: Carey Bullocks M.D.   On: 03/05/2021 16:45   Study Result  Narrative & Impression  CLINICAL DATA:  Bilateral lateral and plantar ankle pain for years. No known injury or prior relevant surgery.   EXAM: MRI OF THE RIGHT ANKLE WITHOUT CONTRAST   TECHNIQUE: Multiplanar, multisequence MR imaging of the ankle was performed. No intravenous contrast was administered.   COMPARISON:  Foot radiographs 02/20/2021   FINDINGS: TENDONS   Peroneal: Intact and normally positioned. Possible mild tenosynovitis of the peroneus longus tendon where it passes along the inferolateral aspect of the calcaneus and  cuboid.   Posteromedial: Intact and normally positioned.   Anterior: Intact and normally positioned.   Achilles: Intact.   Plantar Fascia: Intact.   LIGAMENTS   Lateral: The anterior and posterior  talofibular and calcaneofibular ligaments are intact.   Medial: The deltoid and visualized portions of the spring ligament appear intact.   CARTILAGE AND BONES   Ankle Joint: No significant ankle joint effusion. The talar dome and tibial plafond are intact.   Subtalar Joints/Sinus Tarsi: Unremarkable.   Bones: Mild talonavicular degenerative changes. No acute osseous findings. No evidence of tarsal coalition.   Other: No significant soft tissue findings.   IMPRESSION: 1. Possible mild distal peroneus longus tenosynovitis without tear. 2. Mild talonavicular spurring. 3. No acute osseous or ligamentous findings.     Electronically Signed   By: Carey Bullocks M.D.   On: 03/05/2021 16:40    Assessment:   1. Peroneal tendinitis of both lower legs      Plan:  Patient was evaluated and treated and all questions answered.  Overall continues to do well.  His orthotics are comfortable.  Physical therapy has been very helpful.  We discussed further treatment including corticosteroid tendon sheath and/or platelet rich plasma injections.  I discussed with him if we do a corticosteroid injection he will to be immobilized in a boot one at a time 3 to 4 weeks after the injection.  Discussed the physiology and rationale behind platelet plasma injections.  He will consider this for the future.  No follow-ups on file.

## 2021-09-24 DIAGNOSIS — M25571 Pain in right ankle and joints of right foot: Secondary | ICD-10-CM | POA: Diagnosis not present

## 2021-09-24 DIAGNOSIS — M25561 Pain in right knee: Secondary | ICD-10-CM | POA: Diagnosis not present

## 2021-09-24 DIAGNOSIS — M25572 Pain in left ankle and joints of left foot: Secondary | ICD-10-CM | POA: Diagnosis not present

## 2021-09-24 DIAGNOSIS — M25562 Pain in left knee: Secondary | ICD-10-CM | POA: Diagnosis not present

## 2021-10-01 ENCOUNTER — Telehealth: Payer: Self-pay | Admitting: *Deleted

## 2021-10-01 DIAGNOSIS — F411 Generalized anxiety disorder: Secondary | ICD-10-CM | POA: Diagnosis not present

## 2021-10-01 DIAGNOSIS — D72819 Decreased white blood cell count, unspecified: Secondary | ICD-10-CM | POA: Diagnosis not present

## 2021-10-01 NOTE — Telephone Encounter (Signed)
Patient is calling because he has questions about steroid injections, is this a temporary fix for the foot, what are some of the drawbacks(don't want to go on what he reads on the internet), will he eventually have to have surgery? Please advise.

## 2021-10-01 NOTE — Telephone Encounter (Signed)
Steroid injections help with inflammation which can help to heal the tendon if the inflammation is reduced, the draw back would be he would have to be in a boot and stay off the foot after. The alternative would be PRP injection which he would not need a boot, just a small brace briefly and can restart therapy but it isn't covered by insurance. Both would be to hopefully avoid surgery but I can't guarantee the outcome, the research shows they are helpful

## 2021-10-01 NOTE — Telephone Encounter (Signed)
Please call patient to schedule an appointment.

## 2021-10-02 DIAGNOSIS — M25571 Pain in right ankle and joints of right foot: Secondary | ICD-10-CM | POA: Diagnosis not present

## 2021-10-02 DIAGNOSIS — M25572 Pain in left ankle and joints of left foot: Secondary | ICD-10-CM | POA: Diagnosis not present

## 2021-10-02 DIAGNOSIS — M25562 Pain in left knee: Secondary | ICD-10-CM | POA: Diagnosis not present

## 2021-10-02 DIAGNOSIS — M25561 Pain in right knee: Secondary | ICD-10-CM | POA: Diagnosis not present

## 2021-12-14 ENCOUNTER — Other Ambulatory Visit: Payer: Self-pay | Admitting: Internal Medicine

## 2021-12-14 ENCOUNTER — Ambulatory Visit
Admission: RE | Admit: 2021-12-14 | Discharge: 2021-12-14 | Disposition: A | Discharge status: Home / Self Care | Payer: Self-pay | Source: Ambulatory Visit | Attending: Internal Medicine | Admitting: Internal Medicine

## 2021-12-14 DIAGNOSIS — R0789 Other chest pain: Secondary | ICD-10-CM

## 2021-12-14 DIAGNOSIS — R0602 Shortness of breath: Secondary | ICD-10-CM | POA: Diagnosis not present

## 2021-12-14 DIAGNOSIS — F411 Generalized anxiety disorder: Secondary | ICD-10-CM | POA: Diagnosis not present

## 2021-12-14 IMAGING — CR DG CHEST 2V
2 series · 2 of 2 positions shown · non-contrast
Comparison: None

CLINICAL DATA: Atypical chest pain, shortness of breath

EXAM:
CHEST - 2 VIEW

[w chest pa]
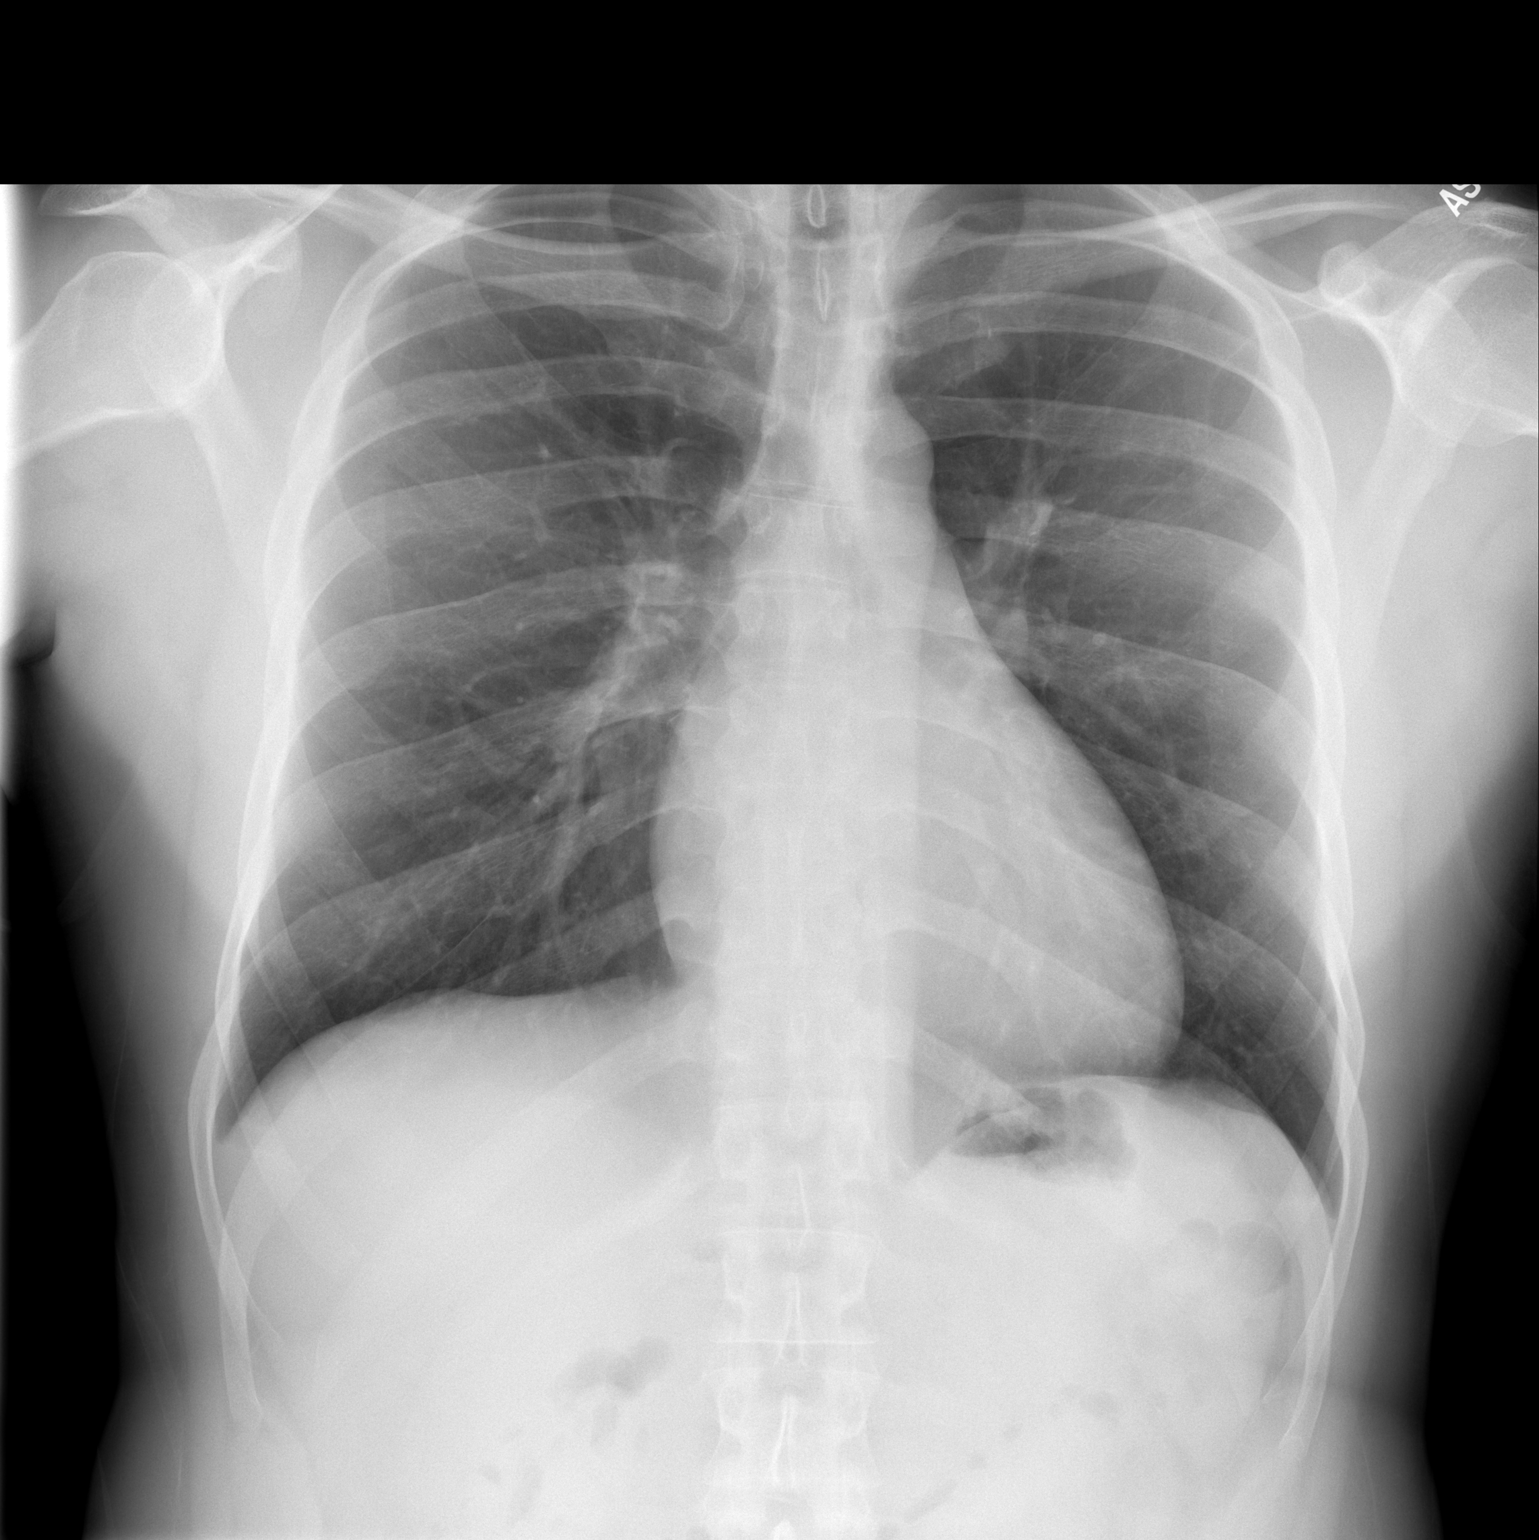

[w chest lat *]
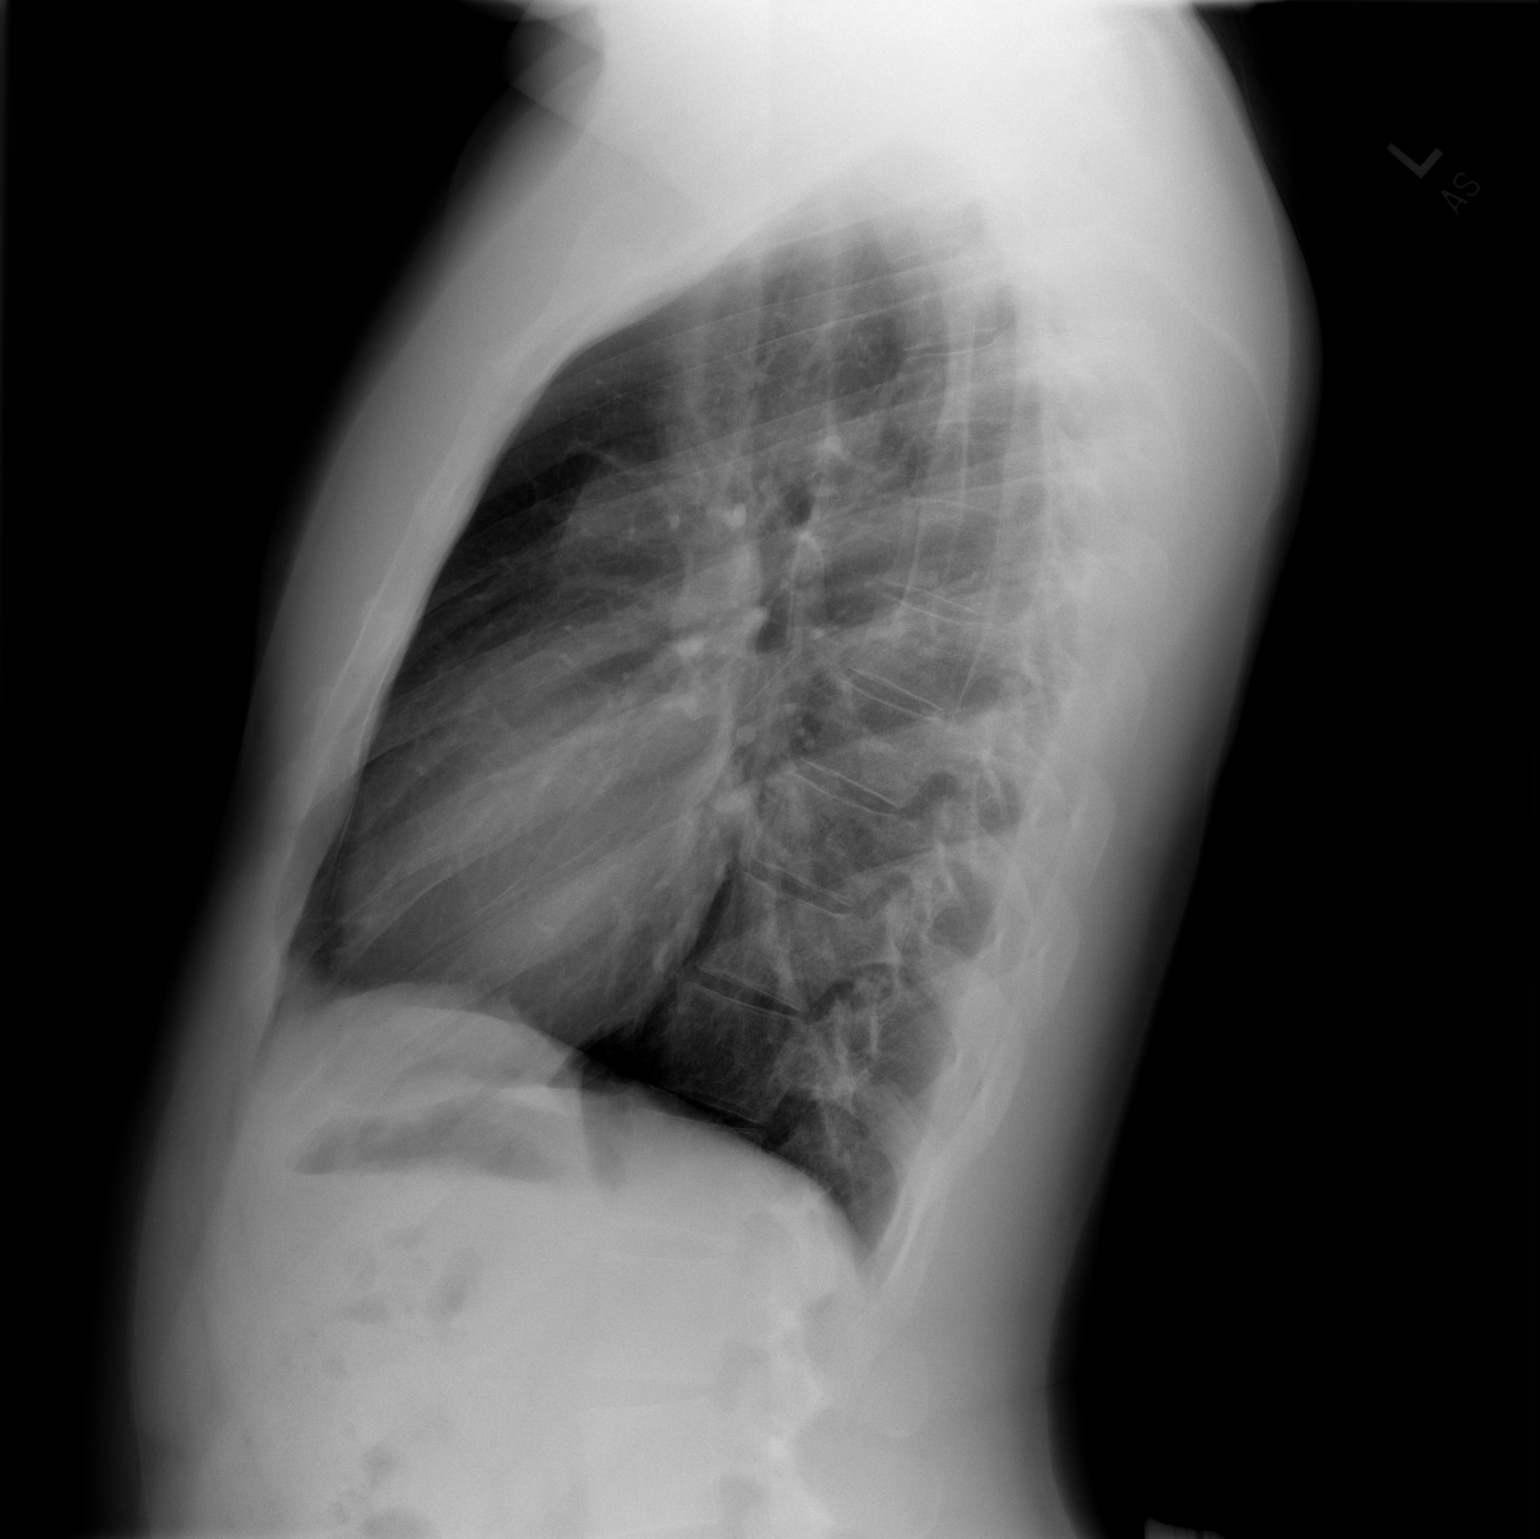

[2 of 2 positions shown; findings below may reference images not displayed]

FINDINGS: Normal heart size, mediastinal contours, and pulmonary vascularity.

Lungs clear.

No pleural effusion or pneumothorax.

Bones unremarkable.
IMPRESSION: Normal exam.

## 2022-01-03 ENCOUNTER — Other Ambulatory Visit (HOSPITAL_COMMUNITY): Payer: Self-pay | Admitting: Internal Medicine

## 2022-01-03 DIAGNOSIS — K219 Gastro-esophageal reflux disease without esophagitis: Secondary | ICD-10-CM | POA: Diagnosis not present

## 2022-01-03 DIAGNOSIS — R946 Abnormal results of thyroid function studies: Secondary | ICD-10-CM | POA: Diagnosis not present

## 2022-01-03 DIAGNOSIS — R030 Elevated blood-pressure reading, without diagnosis of hypertension: Secondary | ICD-10-CM | POA: Diagnosis not present

## 2022-01-03 DIAGNOSIS — F411 Generalized anxiety disorder: Secondary | ICD-10-CM | POA: Diagnosis not present

## 2022-01-03 DIAGNOSIS — R0789 Other chest pain: Secondary | ICD-10-CM

## 2022-01-03 DIAGNOSIS — Z8639 Personal history of other endocrine, nutritional and metabolic disease: Secondary | ICD-10-CM | POA: Diagnosis not present

## 2022-01-09 DIAGNOSIS — R0789 Other chest pain: Secondary | ICD-10-CM | POA: Diagnosis not present

## 2022-01-09 DIAGNOSIS — R946 Abnormal results of thyroid function studies: Secondary | ICD-10-CM | POA: Diagnosis not present

## 2022-01-10 ENCOUNTER — Other Ambulatory Visit: Payer: Self-pay

## 2022-01-10 ENCOUNTER — Ambulatory Visit: Payer: BC Managed Care – PPO | Admitting: Podiatry

## 2022-01-10 DIAGNOSIS — M7672 Peroneal tendinitis, left leg: Secondary | ICD-10-CM

## 2022-01-10 DIAGNOSIS — M7671 Peroneal tendinitis, right leg: Secondary | ICD-10-CM | POA: Diagnosis not present

## 2022-01-11 ENCOUNTER — Telehealth: Payer: Self-pay | Admitting: *Deleted

## 2022-01-11 NOTE — Telephone Encounter (Signed)
Is patient supposed to keep the given boot on at all times or just when up and moving about? Where there any other after care instructions given per patient's girlfriend? Please advise.

## 2022-01-14 ENCOUNTER — Encounter: Payer: Self-pay | Admitting: Podiatry

## 2022-01-14 DIAGNOSIS — F411 Generalized anxiety disorder: Secondary | ICD-10-CM | POA: Diagnosis not present

## 2022-01-14 NOTE — Progress Notes (Addendum)
Subjective:  Patient ID: Rodney Austin, male    DOB: 1990-12-18,  MRN: [id number]  Chief Complaint  Patient presents with   Tendonitis     requested injection, L foot pain    31 y.o. male returns for follow-up with the above complaint. History confirmed with patient.  Right side is doing okay has good days and bad days.  The left side is still been quite painful and he is interested in injection of the tendon.  Objective:  Physical Exam: warm, good capillary refill, no trophic changes or ulcerative lesions, normal DP and PT pulses and normal sensory exam.  Today no pain on the posterior tibial tendon or in the sinus tarsi, he does have pain at the insertion of the peroneus brevis on the fifth metatarsal base bilateral and just distal to the malleolus.  Collapse of the medial arch with heel valgus on weightbearing.  Able to do single and double heel rises.  Gastrocnemius equinus with a positive Silfverskiold test.  He has hammertoes and mild hallux valgus deformity bilaterally.       Radiographs: X-ray of both feet: Pes planus deformity with spurring at the talonavicular and subtalar joint.  Metatarsus primus elevatus is noted.  He has calcaneal valgus with mild forefoot abduction.  On the oblique view there is some irregularity of the anterior process of calcaneus, no gross osseous coalition is noted  Study Result  Narrative & Impression  CLINICAL DATA:  Bilateral lateral and plantar ankle pain for years. No known injury or prior relevant surgery.   EXAM: MRI OF THE LEFT ANKLE WITHOUT CONTRAST   TECHNIQUE: Multiplanar, multisequence MR imaging of the ankle was performed. No intravenous contrast was administered.   COMPARISON:  Radiographs 02/20/2021.   FINDINGS: TENDONS   Peroneal: Intact and normally positioned.   Posteromedial: Intact and normally positioned. Prominent fluid within the flexor hallucis longus tendon sheath, likely due to communication with the joint. A  small amount of fluid is present in the posterior tibialis tendon sheath.   Anterior: Intact and normally positioned.   Achilles: Intact.   Plantar Fascia: Intact.   LIGAMENTS   Lateral: The anterior talofibular ligament is not well visualized and may be chronically torn. The posterior talofibular and calcaneofibular ligaments appear intact.   Medial: The deltoid and visualized portions of the spring ligament appear intact.   CARTILAGE AND BONES   Ankle Joint: No significant ankle joint effusion. The talar dome and tibial plafond are intact.   Subtalar Joints/Sinus Tarsi: Unremarkable.   Bones: Mild talonavicular spurring without evidence of tarsal coalition. Underlying lipoma anteriorly in the calcaneus with fatty and cystic components. No evidence of pathologic fracture.   Other: No significant soft tissue findings.   IMPRESSION: 1. Prominent fluid in the flexor hallucis longus and posterior tibialis tendon sheaths. No evidence of tendon tear. 2. 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 3. Talonavicular spurring without acute osseous findings or evidence of tarsal coalition.     Electronically Signed   By: Carey Bullocks M.D.   On: 03/05/2021 16:45   Study Result  Narrative & Impression  CLINICAL DATA:  Bilateral lateral and plantar ankle pain for years. No known injury or prior relevant surgery.   EXAM: MRI OF THE RIGHT ANKLE WITHOUT CONTRAST   TECHNIQUE: Multiplanar, multisequence MR imaging of the ankle was performed. No intravenous contrast was administered.   COMPARISON:  Foot radiographs 02/20/2021   FINDINGS: TENDONS   Peroneal: Intact and normally positioned. Possible mild tenosynovitis  of the peroneus longus tendon where it passes along the inferolateral aspect of the calcaneus and cuboid.   Posteromedial: Intact and normally positioned.   Anterior: Intact and normally positioned.   Achilles: Intact.   Plantar  Fascia: Intact.   LIGAMENTS   Lateral: The anterior and posterior talofibular and calcaneofibular ligaments are intact.   Medial: The deltoid and visualized portions of the spring ligament appear intact.   CARTILAGE AND BONES   Ankle Joint: No significant ankle joint effusion. The talar dome and tibial plafond are intact.   Subtalar Joints/Sinus Tarsi: Unremarkable.   Bones: Mild talonavicular degenerative changes. No acute osseous findings. No evidence of tarsal coalition.   Other: No significant soft tissue findings.   IMPRESSION: 1. Possible mild distal peroneus longus tenosynovitis without tear. 2. Mild talonavicular spurring. 3. No acute osseous or ligamentous findings.     Electronically Signed   By: Carey Bullocks M.D.   On: 03/05/2021 16:40    Assessment:   1. Peroneal tendinitis of both lower legs      Plan:  Patient was evaluated and treated and all questions answered.  Today we again discussed treatment options.  Discussed risks and benefits of corticosteroid injection versus platelet rich plasma injection of the peroneal tendons on the left side.  He would like to proceed with corticosteroid injection.  After sterile prep with iodine and alcohol the peroneus brevis insertion and peroneus longus tendon sheath were both injected with 0.5 cc of lidocaine 2% plain and 4 mg of dexamethasone.  He tolerated the procedure well.  He was placed into a cam boot for mobilization and will resume physical therapy soon.  I will see him back in 6 weeks.    Return in about 6 weeks (around 02/21/2022) for re-check peroneal tendinitis.

## 2022-01-17 ENCOUNTER — Telehealth: Payer: Self-pay | Admitting: Hematology and Oncology

## 2022-01-17 NOTE — Telephone Encounter (Signed)
Scheduled appt per 2/23 referral. Pt is aware of appt date and time. Pt is aware to arrive 15 mins prior to appt time and to bring and updated insurance card. Pt is aware of appt location.   °

## 2022-01-17 NOTE — Progress Notes (Signed)
°  [facility] CONSULT NOTE  Patient Care Team: Hyacinth Meeker, [place], [place] as PCP - General (Internal Medicine)  CHIEF COMPLAINTS/PURPOSE OF CONSULTATION:  Newly diagnosed leukopenia  HISTORY OF PRESENTING ILLNESS:  Rodney Austin 31 y.o. male is here because of recent diagnosis of leukopenia. He presents to the clinic today for initial evaluation and discussion of treatment options.  He reports that he has noticed that his white count has been slightly below normal for a several years and decided to get a checkup with the hematology to make sure there is no other serious health issue. He had plantar fasciitis injection and is wearing a boot.  His pain is subsiding. He does not have any history of frequent infections or illnesses.  I reviewed her records extensively and collaborated the history with the patient.  MEDICAL HISTORY:  Planter fasciitis  SURGICAL HISTORY: Injection for plantar fasciitis  SOCIAL HISTORY: Denies any tobacco alcohol or recreational drug use  FAMILY HISTORY: No family history of blood disorders    ALLERGIES:  has No Known Allergies.  MEDICATIONS:  Current Outpatient Medications  Medication Sig Dispense Refill   busPIRone (BUSPAR) 5 MG tablet 1 tablet     ergocalciferol (VITAMIN D2) 1.25 MG (50000 UT) capsule TAKE ONE CAPSULE BY MOUTH WEEKLY FOR 24 WEEKS.     meloxicam (MOBIC) 15 MG tablet 1 tablet     No current facility-administered medications for this visit.    REVIEW OF SYSTEMS:   Constitutional: Denies fevers, chills or abnormal night sweats   All other systems were reviewed with the patient and are negative.  PHYSICAL EXAMINATION: ECOG PERFORMANCE STATUS: 1 - Symptomatic but completely ambulatory  Vitals:   01/18/22 1443  BP: 140/84  Pulse: (!) 56  Resp: 18  Temp: 97.8 F (36.6 C)  SpO2: 100%   Filed Weights   01/18/22 1443  Weight: 207 lb 3.2 oz (94 kg)      RADIOGRAPHIC STUDIES: I have personally reviewed the  radiological reports and agreed with the findings in the report.  ASSESSMENT AND PLAN:  Leukopenia Lab review: 01/04/2022: WBC 3.9, hemoglobin 14.6, platelets 179, ANC 1.4, LFTs normal Mild leukopenia: I discussed with the patient of the leukopenia is extremely mild and we do not have any prior labs to compare this with. We will plan to repeat the CBC with differential today and confirm the blood counts and look at the peripheral smear.  We will also check for B12 levels and ANA The rest of the blood counts especially the hemoglobin and platelets are adequate    The most likely possibility for his low white count is ethnicity related.   All questions were answered. The patient knows to call the clinic with any problems, questions or concerns.   Sabas Sous, MD, MPH 01/18/2022    I, Alda Ponder, am acting as scribe for Serena Croissant, MD.  I have reviewed the above documentation for accuracy and completeness, and I agree with the above.

## 2022-01-18 ENCOUNTER — Other Ambulatory Visit: Payer: Self-pay

## 2022-01-18 ENCOUNTER — Inpatient Hospital Stay: Payer: BC Managed Care – PPO | Attending: Hematology and Oncology | Admitting: Hematology and Oncology

## 2022-01-18 ENCOUNTER — Telehealth: Payer: Self-pay | Admitting: Hematology and Oncology

## 2022-01-18 ENCOUNTER — Inpatient Hospital Stay: Payer: BC Managed Care – PPO

## 2022-01-18 DIAGNOSIS — D709 Neutropenia, unspecified: Secondary | ICD-10-CM

## 2022-01-18 DIAGNOSIS — D72819 Decreased white blood cell count, unspecified: Secondary | ICD-10-CM | POA: Diagnosis not present

## 2022-01-18 LAB — CBC WITH DIFFERENTIAL/PLATELET
Abs Immature Granulocytes: 0.03 10*3/uL (ref 0.00–0.07)
Basophils Absolute: 0.1 10*3/uL (ref 0.0–0.1)
Basophils Relative: 1 %
Eosinophils Absolute: 0.1 10*3/uL (ref 0.0–0.5)
Eosinophils Relative: 1 %
HCT: 49.8 % (ref 39.0–52.0)
Hemoglobin: 16.3 g/dL (ref 13.0–17.0)
Immature Granulocytes: 1 %
Lymphocytes Relative: 44 %
Lymphs Abs: 3 10*3/uL (ref 0.7–4.0)
MCH: 31.2 pg (ref 26.0–34.0)
MCHC: 32.7 g/dL (ref 30.0–36.0)
MCV: 95.4 fL (ref 80.0–100.0)
Monocytes Absolute: 0.8 10*3/uL (ref 0.1–1.0)
Monocytes Relative: 13 %
Neutro Abs: 2.6 10*3/uL (ref 1.7–7.7)
Neutrophils Relative %: 40 %
Platelets: 199 10*3/uL (ref 150–400)
RBC: 5.22 MIL/uL (ref 4.22–5.81)
RDW: 11.4 % — ABNORMAL LOW (ref 11.5–15.5)
WBC: 6.6 10*3/uL (ref 4.0–10.5)
nRBC: 0 % (ref 0.0–0.2)

## 2022-01-18 LAB — VITAMIN B12: Vitamin B-12: 664 pg/mL (ref 180–914)

## 2022-01-18 NOTE — Assessment & Plan Note (Signed)
Lab review: 01/04/2022: WBC 3.9, hemoglobin 14.6, platelets 179, ANC 1.4, LFTs normal Mild leukopenia: I discussed with the patient of the leukopenia is extremely mild and we do not have any prior labs to compare this with. We will plan to repeat the CBC with differential today and confirm the blood counts and look at the peripheral smear. The rest of the blood counts especially the hemoglobin and platelets are in adequate shape and therefore we do not intend to perform any bone marrow biopsies at this time.

## 2022-01-18 NOTE — Telephone Encounter (Signed)
Called patient to confirm his telephone visit appointment on Tuesday 2/28 with Dr.Gudena. Patient is aware.

## 2022-01-21 DIAGNOSIS — F411 Generalized anxiety disorder: Secondary | ICD-10-CM | POA: Diagnosis not present

## 2022-01-21 NOTE — Progress Notes (Signed)
°  HEMATOLOGY-ONCOLOGY TELEPHONE VISIT PROGRESS NOTE  I connected with Rodney Austin on 01/22/2022 at  2:00 PM EST by telephone and verified that I am speaking with the correct person using two identifiers.  I discussed the limitations, risks, security and privacy concerns of performing an evaluation and management service by telephone and the availability of in person appointments.  I also discussed with the patient that there may be a patient responsible charge related to this service. The patient expressed understanding and agreed to proceed.   History of Present Illness: Rodney Austin is a 31 y.o. male with above-mentioned history of leukopenia. He presents via telephone today for follow-up.   Observations/Objective:    Assessment Plan:  Leukopenia Lab review: 01/04/2022: WBC 3.9, hemoglobin 14.6, platelets 179, ANC 1.4, LFTs normal Mild leukopenia: I discussed with the patient of the leukopenia is extremely mild  01/18/2022: WBC 6.6, hemoglobin 16.3, platelets 199, ANC 2.6  Based on normalization of lab values I do not recommend any further work-up for the mild leukopenia. It certainly can fluctuate over time depending on multiple factors. We would like to see the patient back if the ANC drops below 1 or platelets drop below 100 or if hemoglobin drops below 10 Please do not hesitate to contact us for any further questions or concerns.    I discussed the assessment and treatment plan with the patient. The patient was provided an opportunity to ask questions and all were answered. The patient agreed with the plan and demonstrated an understanding of the instructions. The patient was advised to call back or seek an in-person evaluation if the symptoms worsen or if the condition fails to improve as anticipated.   Total time spent: 11 mins including non-face to face time and time spent for planning, charting and coordination of care  Rulon Eisenmenger, MD 01/22/2022    I, Thana Ates, am  acting as scribe for Nicholas Lose, MD.  I have reviewed the above documentation for accuracy and completeness, and I agree with the above.

## 2022-01-22 ENCOUNTER — Inpatient Hospital Stay (HOSPITAL_BASED_OUTPATIENT_CLINIC_OR_DEPARTMENT_OTHER): Payer: BC Managed Care – PPO | Admitting: Hematology and Oncology

## 2022-01-22 DIAGNOSIS — D709 Neutropenia, unspecified: Secondary | ICD-10-CM

## 2022-01-22 NOTE — Assessment & Plan Note (Signed)
Lab review: 01/04/2022: WBC 3.9, hemoglobin 14.6, platelets 179, ANC 1.4, LFTs normal Mild leukopenia: I discussed with the patient of the leukopenia is extremely mild  01/18/2022: WBC 6.6, hemoglobin 16.3, platelets 199, ANC 2.6  Based on normalization of lab values I do not recommend any further work-up for the mild leukopenia. It certainly can fluctuate over time depending on multiple factors. We would like to see the patient back if the ANC drops below 1 or platelets drop below 100 or if hemoglobin drops below 10 Please do not hesitate to contact us for any further questions or concerns.

## 2022-01-23 ENCOUNTER — Other Ambulatory Visit: Payer: Self-pay

## 2022-01-23 ENCOUNTER — Ambulatory Visit: Payer: BC Managed Care – PPO | Admitting: Interventional Cardiology

## 2022-01-23 ENCOUNTER — Encounter: Payer: Self-pay | Admitting: Interventional Cardiology

## 2022-01-23 VITALS — BP 128/76 | HR 74 | Ht 71.5 in | Wt 213.0 lb

## 2022-01-23 DIAGNOSIS — R030 Elevated blood-pressure reading, without diagnosis of hypertension: Secondary | ICD-10-CM

## 2022-01-23 DIAGNOSIS — I361 Nonrheumatic tricuspid (valve) insufficiency: Secondary | ICD-10-CM | POA: Diagnosis not present

## 2022-01-23 DIAGNOSIS — R072 Precordial pain: Secondary | ICD-10-CM

## 2022-01-23 DIAGNOSIS — R0609 Other forms of dyspnea: Secondary | ICD-10-CM

## 2022-01-23 LAB — BASIC METABOLIC PANEL
BUN/Creatinine Ratio: 11 (ref 9–20)
BUN: 16 mg/dL (ref 6–20)
CO2: 26 mmol/L (ref 20–29)
Calcium: 9.5 mg/dL (ref 8.7–10.2)
Chloride: 98 mmol/L (ref 96–106)
Creatinine, Ser: 1.4 mg/dL — ABNORMAL HIGH (ref 0.76–1.27)
Glucose: 91 mg/dL (ref 70–99)
Potassium: 4.3 mmol/L (ref 3.5–5.2)
Sodium: 137 mmol/L (ref 134–144)
eGFR: 69 mL/min/{1.73_m2} (ref >59)

## 2022-01-23 MED ORDER — METOPROLOL TARTRATE 100 MG PO TABS: ORAL_TABLET | ORAL | 0 refills | Status: AC

## 2022-01-23 NOTE — Progress Notes (Signed)
?  ?Cardiology Office Note ? ? ?Date:  01/23/2022  ? ?ID:  Rodney Austin, DOB 1991-06-10, MRN [id number] ? ?PCP:  Rodney Mares, PA  ? ? ?Chief Complaint  ?Patient presents with  ? Establish Care  ? ?Chest pain ? ?Wt Readings from Last 3 Encounters:  ?01/23/22 213 lb (96.6 kg)  ?01/18/22 207 lb 3.2 oz (94 kg)  ?  ? ?  ?History of Present Illness: ?Rodney Austin is a 31 y.o. male who is being seen today for the evaluation of exertional dyspnea/CP at the request of [place], [place], [place].  ? ?Symptoms of chest pain starting in 2020.  He felt like it was related to anxiety.  Shortness of breath has been more recent.  Over the last few months, blood pressure has been elevated.  Highest has been 144/91 at home.   Typically [id number].   ? ?Chest pain now is more under the left axilla.  He feels it when BP is higher.  Sees the 140s readings daily.  He checks BP up to 3 times a day.  HTN runs on the Mom's side. ? ?He exercises 3x/week.   He uses a step machine for 10 minutes followed by weights.  After that he will stretch.  He has had a left knee injury and a left foot injury.  Upper body is healthy.  ? ?No CP or SHOB with stepper exercise.  Breathing gets easier.  ? ?Echo showed Normal LV/RV function.  Normal aortic valve and mitral valve function.  Mild - moderate TR.  PASP 34 mm Hg.   ? ?Denies :  Dizziness. Leg edema. Nitroglycerin use. Orthopnea. Palpitations. Paroxysmal nocturnal dyspnea. Syncope.   ? ?Past Medical History:  ?Diagnosis Date  ? Anxiety   ? Asthma   ? Bilateral knee pain   ? Chronic pain   ? Depression   ? GERD (gastroesophageal reflux disease)   ? HTN (hypertension)   ? Hypothyroidism   ? Leukopenia   ? Tricuspid valve insufficiency   ? ? ?No past surgical history on file. ? ? ?Current Outpatient Medications  ?Medication Sig Dispense Refill  ? amLODipine (NORVASC) 2.5 MG tablet Take 1 tablet by mouth daily.    ? calcium carbonate (TUMS - DOSED IN MG ELEMENTAL CALCIUM) 500 MG chewable tablet Chew  1 tablet by mouth as needed.    ? ?No current facility-administered medications for this visit.  ? ? ?Allergies:   Patient has no known allergies.  ? ? ?Social History:  The patient  reports that he has quit smoking. His smoking use included cigarettes. He has never been exposed to tobacco smoke. He has never used smokeless tobacco.  ? ?Family History:  The patient's family history includes Hypertension in his maternal grandfather, maternal uncle, and mother; Thyroid disease in his maternal grandmother.  ? ? ?ROS:  Please see the history of present illness.   Otherwise, review of systems are positive for chest pain, stress.   All other systems are reviewed and negative.  ? ? ?PHYSICAL EXAM: ?VS:  BP 128/76   Pulse 74   Ht 5' 11.5" (1.816 m)   Wt 213 lb (96.6 kg)   SpO2 98%   BMI 29.29 kg/m?  , BMI Body mass index is 29.29 kg/m?. ?GEN: Well nourished, well developed, in no acute distress ?HEENT: normal ?Neck: no JVD, carotid bruits, or masses ?Cardiac: RRR; no murmurs, rubs, or gallops,no edema  ?Respiratory:  clear to auscultation bilaterally, normal work of breathing ?GI:  soft, nontender, nondistended, + BS ?MS: no deformity or atrophy ?Skin: warm and dry, no rash ?Neuro:  Strength and sensation are intact ?Psych: euthymic mood, full affect ? ? ?EKG:   ?The ekg ordered today demonstrates NSR , inferior ST changes ? ? ?Recent Labs: ?01/18/2022: Hemoglobin 16.3; Platelets 199  ? ?Lipid Panel ?No results found for: CHOL, TRIG, HDL, CHOLHDL, VLDL, LDLCALC, LDLDIRECT ?  ?Other studies Reviewed: ?Additional studies/ records that were reviewed today with results demonstrating: LDL 70. Cr 1.17. ? ? ?ASSESSMENT AND PLAN: ? ?Chest pain: Several atypical features.  This has been persistent for years.  The more recent dyspnea on exertion is also in an atypical pattern for coronary artery disease.  We will check coronary CTA to rule out coronary disease.  Metoprolol 100 mg prior to the CTA.  We will cancel exercise stress  test as he is wearing a boot at this time. ?Borderline blood pressure: He has some readings in the 140s.  I think more cardio exercise and less salt in his diet may help.  I would rather use lifestyle changes to help with blood pressure of the medications given his young age. ?Tricuspid regurgitation: Mild to moderate.  No signs of right heart failure.  RV function was normal on echocardiogram report.  No further management needed at this time.  No indication for SBE prophylaxis. ? ? ?Current medicines are reviewed at length with the patient today.  The patient concerns regarding his medicines were addressed. ? ?The following changes have been made:  No change ? ?Labs/ tests ordered today include:  ?No orders of the defined types were placed in this encounter. ? ? ?Recommend 150 minutes/week of aerobic exercise ?Low fat, low carb, high fiber diet recommended ? ?Disposition:   FU for CTA ? ? ?Signed, ?Lance Muss, MD  ?01/23/2022 11:14 AM    ?[facility] ?[address] [place], [place], [place]  [address] ?Phone: [phone]; Fax: [phone]  ? ?

## 2022-01-23 NOTE — Patient Instructions (Addendum)
Medication Instructions:  Your physician recommends that you continue on your current medications as directed. Please refer to the Current Medication list given to you today.  *If you need a refill on your cardiac medications before your next appointment, please call your pharmacy*   Lab Work: Lab work to be done today--BMP If you have labs (blood work) drawn today and your tests are completely normal, you will receive your results only by: MyChart Message (if you have MyChart) OR A paper copy in the mail If you have any lab test that is abnormal or we need to change your treatment, we will call you to review the results.   Testing/Procedures: Your physician has requested that you have cardiac CT. Cardiac computed tomography (CT) is a painless test that uses an x-ray machine to take clear, detailed pictures of your heart. For further information please visit [email or web address] Please follow instruction sheet as given.     Follow-Up: At [facility], you and your health needs are our priority.  As part of our continuing mission to provide you with exceptional heart care, we have created designated Provider Care Teams.  These Care Teams include your primary Cardiologist (physician) and Advanced Practice Providers (APPs -  Physician Assistants and Nurse Practitioners) who all work together to provide you with the care you need, when you need it.  We recommend signing up for the patient portal called "MyChart".  Sign up information is provided on this After Visit Summary.  MyChart is used to connect with patients for Virtual Visits (Telemedicine).  Patients are able to view lab/test results, encounter notes, upcoming appointments, etc.  Non-urgent messages can be sent to your provider as well.   To learn more about what you can do with MyChart, go to [email or web address].    Your next appointment:   Based on results  The format for your next appointment:   In Person  Provider:    Lance Muss, MD     Other Instructions   Your cardiac CT will be scheduled at one of the below locations:   [facility] [address] [place], [place] [address] [phone]  OR  [facility] [address] [place], [place] [address] [phone]  If scheduled at [facility], please arrive at the [facility] and Children's Entrance (Entrance C2) of [facility] 30 minutes prior to test start time. You can use the FREE valet parking offered at entrance C (encouraged to control the heart rate for the test)  Proceed to the [facility] Radiology Department (first floor) to check-in and test prep.  All radiology patients and guests should use entrance C2 at [facility], accessed from [facility], even though the hospital's physical address listed is [address]    If scheduled at [facility], please arrive 15 mins early for check-in and test prep.  Please follow these instructions carefully (unless otherwise directed):  Hold all erectile dysfunction medications at least 3 days (72 hrs) prior to test.  On the Night Before the Test: Be sure to Drink plenty of water. Do not consume any caffeinated/decaffeinated beverages or chocolate 12 hours prior to your test. Do not take any antihistamines 12 hours prior to your test.   On the Day of the Test: Drink plenty of water until 1 hour prior to the test. Do not eat any food 4 hours prior to the test. You may take your regular medications prior  to the test.  Take metoprolol (Lopressor) two hours prior to test. HOLD Furosemide/Hydrochlorothiazide morning of the test. FEMALES- please wear underwire-free bra if available, avoid dresses & tight clothing        After the Test: Drink plenty of water. After receiving IV contrast, you may experience a mild flushed feeling. This is normal. On occasion,  you may experience a mild rash up to 24 hours after the test. This is not dangerous. If this occurs, you can take Benadryl 25 mg and increase your fluid intake. If you experience trouble breathing, this can be serious. If it is severe call 911 IMMEDIATELY. If it is mild, please call our office. If you take any of these medications: Glipizide/Metformin, Avandament, Glucavance, please do not take 48 hours after completing test unless otherwise instructed.  We will call to schedule your test 2-4 weeks out understanding that some insurance companies will need an authorization prior to the service being performed.   For non-scheduling related questions, please contact the cardiac imaging nurse navigator should you have any questions/concerns: Rockwell Alexandria, Cardiac Imaging Nurse Navigator Larey Brick, Cardiac Imaging Nurse Navigator [facility] Direct Office Dial: [phone]   For scheduling needs, including cancellations and rescheduling, please call [place], [phone].  Your provider recommends that you maintain 150 minutes per week of moderate aerobic activity.   High-Fiber Eating Plan Fiber, also called dietary fiber, is a type of carbohydrate. It is found foods such as fruits, vegetables, whole grains, and beans. A high-fiber diet can have many health benefits. Your health care provider may recommend a high-fiber diet to help: Prevent constipation. Fiber can make your bowel movements more regular. Lower your cholesterol. Relieve the following conditions: Inflammation of veins in the anus (hemorrhoids). Inflammation of specific areas of the digestive tract (uncomplicated diverticulosis). A problem of the large intestine, also called the colon, that sometimes causes pain and diarrhea (irritable bowel syndrome, or IBS). Prevent overeating as part of a weight-loss plan. Prevent heart disease, type 2 diabetes, and certain cancers. What are tips for following  this plan? Reading food labels  Check the nutrition facts label on food products for the amount of dietary fiber. Choose foods that have 5 grams of fiber or more per serving. The goals for recommended daily fiber intake include: Men (age 29 or younger): 34-38 g. Men (over age 7): 28-34 g. Women (age 48 or younger): 25-28 g. Women (over age 72): 22-25 g. Your daily fiber goal is _____________ g. Shopping Choose whole fruits and vegetables instead of processed forms, such as apple juice or applesauce. Choose a wide variety of high-fiber foods such as avocados, lentils, oats, and kidney beans. Read the nutrition facts label of the foods you choose. Be aware of foods with added fiber. These foods often have high sugar and sodium amounts per serving. Cooking Use whole-grain flour for baking and cooking. Cook with brown rice instead of white rice. Meal planning Start the day with a breakfast that is high in fiber, such as a cereal that contains 5 g of fiber or more per serving. Eat breads and cereals that are made with whole-grain flour instead of refined flour or white flour. Eat brown rice, bulgur wheat, or millet instead of white rice. Use beans in place of meat in soups, salads, and pasta dishes. Be sure that half of the grains you eat each day are whole grains. General information You can get the recommended daily intake of dietary fiber by: Eating a  variety of fruits, vegetables, grains, nuts, and beans. Taking a fiber supplement if you are not able to take in enough fiber in your diet. It is better to get fiber through food than from a supplement. Gradually increase how much fiber you consume. If you increase your intake of dietary fiber too quickly, you may have bloating, cramping, or gas. Drink plenty of water to help you digest fiber. Choose high-fiber snacks, such as berries, raw vegetables, nuts, and popcorn. What foods should I eat? Fruits Berries. Pears. Apples. Oranges.  Avocado. Prunes and raisins. Dried figs. Vegetables Sweet potatoes. Spinach. Kale. Artichokes. Cabbage. Broccoli. Cauliflower. Green peas. Carrots. Squash. Grains Whole-grain breads. Multigrain cereal. Oats and oatmeal. Brown rice. Barley. Bulgur wheat. Millet. Quinoa. Bran muffins. Popcorn. Rye wafer crackers. Meats and other proteins Navy beans, kidney beans, and pinto beans. Soybeans. Split peas. Lentils. Nuts and seeds. Dairy Fiber-fortified yogurt. Beverages Fiber-fortified soy milk. Fiber-fortified orange juice. Other foods Fiber bars. The items listed above may not be a complete list of recommended foods and beverages. Contact a dietitian for more information. What foods should I avoid? Fruits Fruit juice. Cooked, strained fruit. Vegetables Fried potatoes. Canned vegetables. Well-cooked vegetables. Grains White bread. Pasta made with refined flour. White rice. Meats and other proteins Fatty cuts of meat. Fried chicken or fried fish. Dairy Milk. Yogurt. Cream cheese. Sour cream. Fats and oils Butters. Beverages Soft drinks. Other foods Cakes and pastries. The items listed above may not be a complete list of foods and beverages to avoid. Talk with your dietitian about what choices are best for you. Summary Fiber is a type of carbohydrate. It is found in foods such as fruits, vegetables, whole grains, and beans. A high-fiber diet has many benefits. It can help to prevent constipation, lower blood cholesterol, aid weight loss, and reduce your risk of heart disease, diabetes, and certain cancers. Increase your intake of fiber gradually. Increasing fiber too quickly may cause cramping, bloating, and gas. Drink plenty of water while you increase the amount of fiber you consume. The best sources of fiber include whole fruits and vegetables, whole grains, nuts, seeds, and beans. This information is not intended to replace advice given to you by your health care provider. Make sure  you discuss any questions you have with your health care provider. Document Revised: 03/16/2020 Document Reviewed: 03/16/2020 Elsevier Patient Education  2022 Elsevier Inc.  Low-Sodium Eating Plan Sodium, which is an element that makes up salt, helps you maintain a healthy balance of fluids in your body. Too much sodium can increase your blood pressure and cause fluid and waste to be held in your body. Your health care provider or dietitian may recommend following this plan if you have high blood pressure (hypertension), kidney disease, liver disease, or heart failure. Eating less sodium can help lower your blood pressure, reduce swelling, and protect your heart, liver, and kidneys. What are tips for following this plan? Reading food labels The Nutrition Facts label lists the amount of sodium in one serving of the food. If you eat more than one serving, you must multiply the listed amount of sodium by the number of servings. Choose foods with less than 140 mg of sodium per serving. Avoid foods with 300 mg of sodium or more per serving. Shopping  Look for lower-sodium products, often labeled as "low-sodium" or "no salt added." Always check the sodium content, even if foods are labeled as "unsalted" or "no salt added." Buy fresh foods. Avoid canned foods and pre-made  or frozen meals. Avoid canned, cured, or processed meats. Buy breads that have less than 80 mg of sodium per slice. Cooking  Eat more home-cooked food and less restaurant, buffet, and fast food. Avoid adding salt when cooking. Use salt-free seasonings or herbs instead of table salt or sea salt. Check with your health care provider or pharmacist before using salt substitutes. Cook with plant-based oils, such as canola, sunflower, or olive oil. Meal planning When eating at a restaurant, ask that your food be prepared with less salt or no salt, if possible. Avoid dishes labeled as brined, pickled, cured, smoked, or made with soy  sauce, miso, or teriyaki sauce. Avoid foods that contain MSG (monosodium glutamate). MSG is sometimes added to [place] food, bouillon, and some canned foods. Make meals that can be grilled, baked, poached, roasted, or steamed. These are generally made with less sodium. General information Most people on this plan should limit their sodium intake to 1,500-2,000 mg (milligrams) of sodium each day. What foods should I eat? Fruits Fresh, frozen, or canned fruit. Fruit juice. Vegetables Fresh or frozen vegetables. "No salt added" canned vegetables. "No salt added" tomato sauce and paste. Low-sodium or reduced-sodium tomato and vegetable juice. Grains Low-sodium cereals, including oats, puffed wheat and rice, and shredded wheat. Low-sodium crackers. Unsalted rice. Unsalted pasta. Low-sodium bread. Whole-grain breads and whole-grain pasta. Meats and other proteins Fresh or frozen (no salt added) meat, poultry, seafood, and fish. Low-sodium canned tuna and salmon. Unsalted nuts. Dried peas, beans, and lentils without added salt. Unsalted canned beans. Eggs. Unsalted nut butters. Dairy Milk. Soy milk. Cheese that is naturally low in sodium, such as ricotta cheese, fresh mozzarella, or Swiss cheese. Low-sodium or reduced-sodium cheese. Cream cheese. Yogurt. Seasonings and condiments Fresh and dried herbs and spices. Salt-free seasonings. Low-sodium mustard and ketchup. Sodium-free salad dressing. Sodium-free light mayonnaise. Fresh or refrigerated horseradish. Lemon juice. Vinegar. Other foods Homemade, reduced-sodium, or low-sodium soups. Unsalted popcorn and pretzels. Low-salt or salt-free chips. The items listed above may not be a complete list of foods and beverages you can eat. Contact a dietitian for more information. What foods should I avoid? Vegetables Sauerkraut, pickled vegetables, and relishes. Olives. [place] fries. Onion rings. Regular canned vegetables (not low-sodium or reduced-sodium).  Regular canned tomato sauce and paste (not low-sodium or reduced-sodium). Regular tomato and vegetable juice (not low-sodium or reduced-sodium). Frozen vegetables in sauces. Grains Instant hot cereals. Bread stuffing, pancake, and biscuit mixes. Croutons. Seasoned rice or pasta mixes. Noodle soup cups. Boxed or frozen macaroni and cheese. Regular salted crackers. Self-rising flour. Meats and other proteins Meat or fish that is salted, canned, smoked, spiced, or pickled. Precooked or cured meat, such as sausages or meat loaves. Tomasa Blase. Ham. Pepperoni. Hot dogs. Corned beef. Chipped beef. Salt pork. Jerky. Pickled herring. Anchovies and sardines. Regular canned tuna. Salted nuts. Dairy Processed cheese and cheese spreads. Hard cheeses. Cheese curds. Blue cheese. Feta cheese. String cheese. Regular cottage cheese. Buttermilk. Canned milk. Fats and oils Salted butter. Regular margarine. Ghee. Bacon fat. Seasonings and condiments Onion salt, garlic salt, seasoned salt, table salt, and sea salt. Canned and packaged gravies. Worcestershire sauce. Tartar sauce. Barbecue sauce. Teriyaki sauce. Soy sauce, including reduced-sodium. Steak sauce. Fish sauce. Oyster sauce. Cocktail sauce. Horseradish that you find on the shelf. Regular ketchup and mustard. Meat flavorings and tenderizers. Bouillon cubes. Hot sauce. Pre-made or packaged marinades. Pre-made or packaged taco seasonings. Relishes. Regular salad dressings. Salsa. Other foods Salted popcorn and pretzels. Corn chips and puffs. Potato  and tortilla chips. Canned or dried soups. Pizza. Frozen entrees and pot pies. The items listed above may not be a complete list of foods and beverages you should avoid. Contact a dietitian for more information. Summary Eating less sodium can help lower your blood pressure, reduce swelling, and protect your heart, liver, and kidneys. Most people on this plan should limit their sodium intake to 1,500-2,000 mg (milligrams) of  sodium each day. Canned, boxed, and frozen foods are high in sodium. Restaurant foods, fast foods, and pizza are also very high in sodium. You also get sodium by adding salt to food. Try to cook at home, eat more fresh fruits and vegetables, and eat less fast food and canned, processed, or prepared foods. This information is not intended to replace advice given to you by your health care provider. Make sure you discuss any questions you have with your health care provider. Document Revised: 12/17/2019 Document Reviewed: 10/13/2019 Elsevier Patient Education  2022 [employer]

## 2022-01-24 LAB — ANTINUCLEAR ANTIBODIES, IFA: ANA Ab, IFA: NEGATIVE

## 2022-01-30 DIAGNOSIS — R946 Abnormal results of thyroid function studies: Secondary | ICD-10-CM | POA: Diagnosis not present

## 2022-01-30 DIAGNOSIS — D72819 Decreased white blood cell count, unspecified: Secondary | ICD-10-CM | POA: Diagnosis not present

## 2022-01-30 DIAGNOSIS — R7989 Other specified abnormal findings of blood chemistry: Secondary | ICD-10-CM | POA: Diagnosis not present

## 2022-01-30 DIAGNOSIS — I071 Rheumatic tricuspid insufficiency: Secondary | ICD-10-CM | POA: Diagnosis not present

## 2022-01-30 DIAGNOSIS — K219 Gastro-esophageal reflux disease without esophagitis: Secondary | ICD-10-CM | POA: Diagnosis not present

## 2022-02-04 ENCOUNTER — Telehealth (HOSPITAL_COMMUNITY): Payer: Self-pay | Admitting: Emergency Medicine

## 2022-02-04 NOTE — Telephone Encounter (Signed)
Reaching out to patient to offer assistance regarding upcoming cardiac imaging study; pt verbalizes understanding of appt date/time, parking situation and where to check in, pre-test NPO status and medications ordered, and verified current allergies; name and call back number provided for further questions should they arise Kharlie Bring RN Navigator Cardiac Imaging [facility] [phone] office [phone] cell  Denies iv issues 100mg metoprolol tartrate Arrival 330 

## 2022-02-05 ENCOUNTER — Other Ambulatory Visit: Payer: Self-pay

## 2022-02-05 ENCOUNTER — Other Ambulatory Visit: Payer: Self-pay | Admitting: Gastroenterology

## 2022-02-05 ENCOUNTER — Ambulatory Visit (HOSPITAL_COMMUNITY)
Admission: RE | Admit: 2022-02-05 | Discharge: 2022-02-05 | Disposition: A | Discharge status: Home / Self Care | Payer: BC Managed Care – PPO | Source: Ambulatory Visit | Attending: Interventional Cardiology | Admitting: Interventional Cardiology

## 2022-02-05 ENCOUNTER — Encounter (HOSPITAL_COMMUNITY): Payer: Self-pay

## 2022-02-05 DIAGNOSIS — R0609 Other forms of dyspnea: Secondary | ICD-10-CM | POA: Insufficient documentation

## 2022-02-05 DIAGNOSIS — R1310 Dysphagia, unspecified: Secondary | ICD-10-CM

## 2022-02-05 DIAGNOSIS — R072 Precordial pain: Secondary | ICD-10-CM

## 2022-02-05 IMAGING — CT CT HEART MORP W/ CTA COR W/ SCORE W/ CA W/CM &/OR W/O CM
4 of 7 series · 8 of 20 positions shown, 9 images · IV contrast (APPLIED)
Comparison: Radiograph [DATE]
COMPARISON: Radiograph [DATE]

Addendum:
EXAM:
OVER-READ INTERPRETATION  CT CHEST

The following report is an over-read performed by radiologist Dr.
NOMASIBULELE [REDACTED] on [DATE]. This over-read
does not include interpretation of cardiac or coronary anatomy or
pathology. The coronary CTA interpretation by the cardiologist is
attached.
CLINICAL DATA: Chest pain
Cardiac/Coronary CTA
TECHNIQUE: A non-contrast, gated CT scan was obtained with axial slices of 3 mm
through the heart for calcium scoring. Calcium scoring was performed
using the Agatston method. A 120 kV prospective, gated, contrast
cardiac scan was obtained. Gantry rotation speed was 250 msecs and
collimation was 0.6 mm. Two sublingual nitroglycerin tablets (0.8
mg) were given. The 3D data set was reconstructed in 5% intervals of
the 35-75% of the R-R cycle. Diastolic phases were analyzed on a
dedicated workstation using MPR, MIP, and VRT modes. The patient
received 95 cc of contrast.

[Series 6: ts diast sharp · axial · 0.39mm/px · z∈[+76,+114]mm · 2 of 286 slices shown]
[im 96/286  lung]
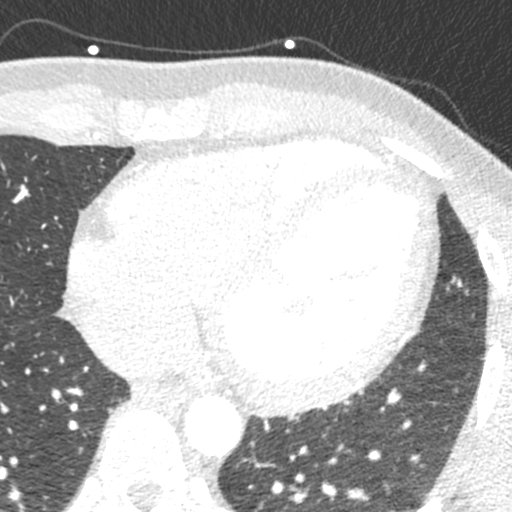
[im 191/286  lung]
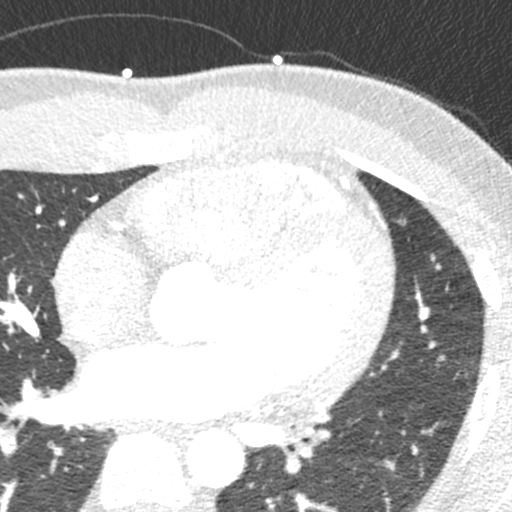

[Series 7: ts syst sharp · axial · 0.39mm/px · z∈[+76,+114]mm · 2 of 286 slices shown]
[im 96/286  lung]
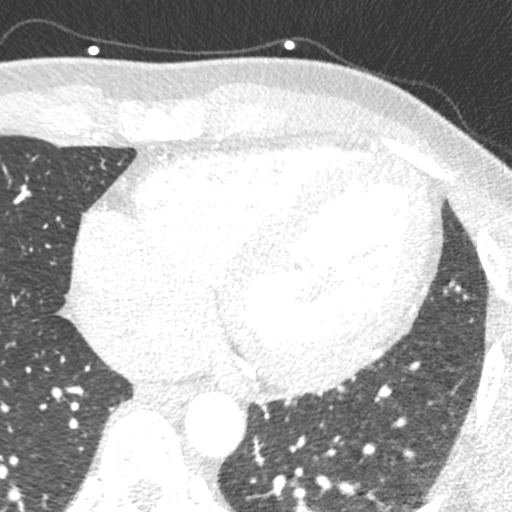
[im 191/286  lung]
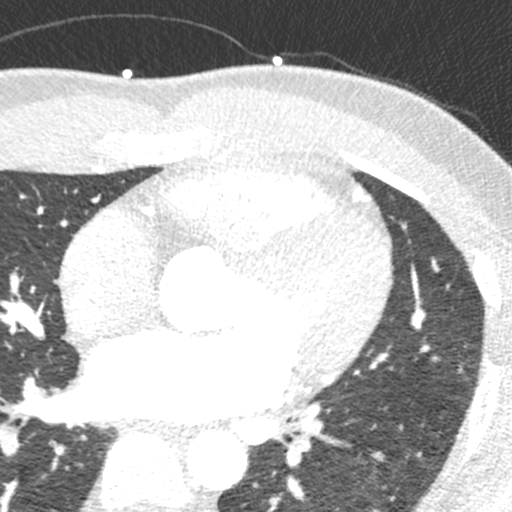

[Series 8: best syst · axial · 0.39mm/px · z∈[+76,+114]mm · 2 of 286 slices shown, 3 images]
[im 96/286  vessel]
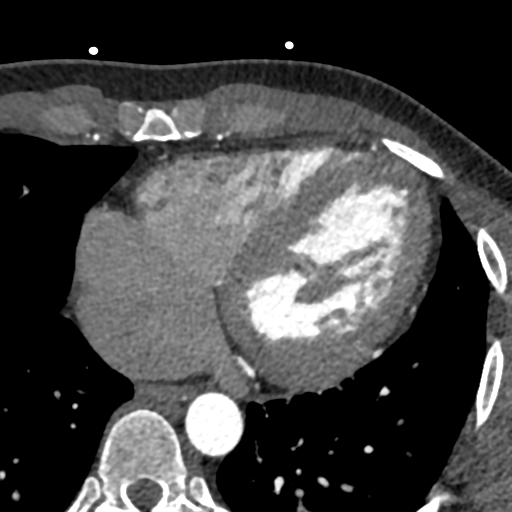
[im 96/286  lung]
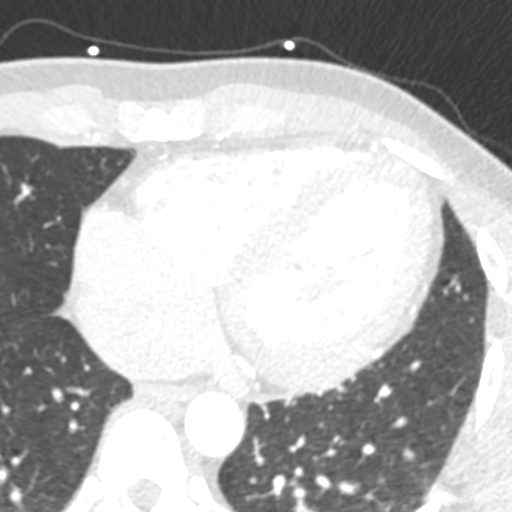
[im 191/286  vessel]
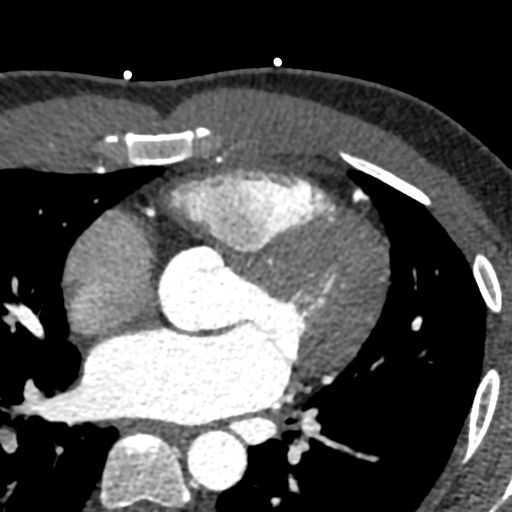

[Series 9: best diast · axial · 0.39mm/px · z∈[+76,+114]mm · 2 of 286 slices shown]
[im 96/286  vessel]
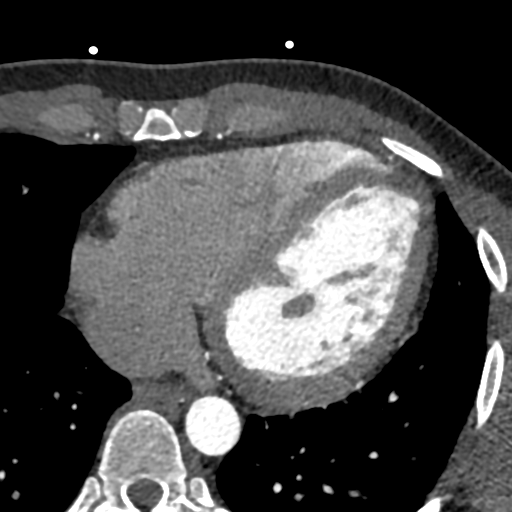
[im 191/286  vessel]
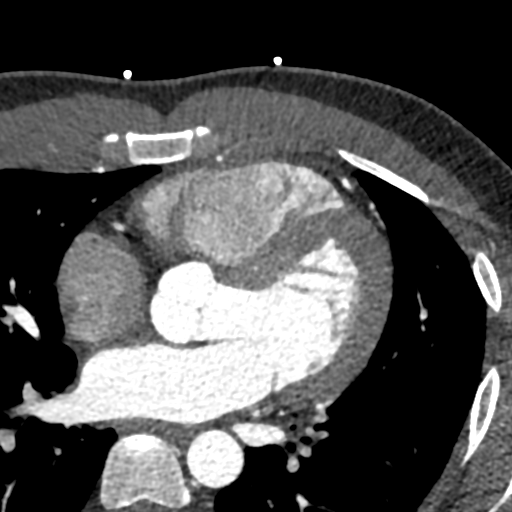

[8 of 20 positions shown; findings below may reference images not displayed]

FINDINGS: Vascular: No significant vascular findings. Normal heart size. No
pericardial effusion.

Mediastinum/Nodes: No enlarged mediastinal or axillary lymph nodes.
Thyroid gland, trachea, and esophagus demonstrate no significant
findings.

Lungs/Pleura: Lungs are clear. No pleural effusion or pneumothorax.
No suspicious pulmonary nodules.

Upper Abdomen: No acute abnormality.

Musculoskeletal: No chest wall mass or suspicious bone lesions
identified.
IMPRESSION: No acute extracardiac findings in the chest. Interpretation of
cardiac and coronary anatomy by cardiology to follow.
FINDINGS: Image quality: Excellent.

Noise artifact is: Limited.

Coronary Arteries:  Normal coronary origin.  Left dominance.

Left main: The left main is a large caliber vessel with a normal
take off from the left coronary cusp that trifurcates into a LAD,
LCX, and ramus intermedius. There is no plaque or stenosis.

Left anterior descending artery: The LAD is patent without evidence
of plaque or stenosis. The LAD gives off 4 very small patent
diagonal branches.

Ramus intermedius: Very small caliber and patent with no evidence of
plaque or stenosis.

Left circumflex artery: The LCX is dominant and patent with no
evidence of plaque or stenosis. The LCX gives off 1 moderate sized
OM1, a large branching OM2 and a PDA.

Right coronary artery: The RCA is non dominant with normal take off
from the right coronary cusp. There is no evidence of plaque or
stenosis.

Right Atrium: Right atrial size is within normal limits.

Right Ventricle: The right ventricular cavity is within normal
limits.

Left Atrium: Left atrial size is normal in size with no left atrial
appendage filling defect.

Left Ventricle: The ventricular cavity size is within normal limits.
There are no stigmata of prior infarction. There is no abnormal
filling defect.

Pulmonary arteries: Normal in size without proximal filling defect.

Pulmonary veins: Normal pulmonary venous drainage.

Pericardium: Normal thickness with no significant effusion or
calcium present.

Cardiac valves: The aortic valve is trileaflet without significant
calcification. The mitral valve is normal structure without
significant calcification.

Aorta: Normal caliber with no significant disease.

Extra-cardiac findings: See attached radiology report for
non-cardiac structures.
IMPRESSION: 1. Coronary calcium score of 0. This was 0 percentile for age-, sex,
and race-matched controls.

2. Normal coronary origin with left dominance.

3. Normal coronary arteries.  CAD RADS 0.

4.  Consider non atherosclerotic causes of chest pain.

RECOMMENDATIONS:
1. CAD-RADS 0: No evidence of CAD (0%). Consider non-atherosclerotic
causes of chest pain.

2. CAD-RADS 1: Minimal non-obstructive CAD (0-24%). Consider
non-atherosclerotic causes of chest pain. Consider preventive
therapy and risk factor modification.

3. CAD-RADS 2: Mild non-obstructive CAD (25-49%). Consider
non-atherosclerotic causes of chest pain. Consider preventive
therapy and risk factor modification.

4. CAD-RADS 3: Moderate stenosis. Consider symptom-guided
anti-ischemic pharmacotherapy as well as risk factor modification
per guideline directed care. Additional analysis with CT FFR will be
submitted.

5. CAD-RADS 4: Severe stenosis. (70-99% or > 50% left main). Cardiac
catheterization or CT FFR is recommended. Consider symptom-guided
anti-ischemic pharmacotherapy as well as risk factor modification
per guideline directed care. Invasive coronary angiography
recommended with revascularization per published guideline
statements.

6. CAD-RADS 5: Total coronary occlusion (100%). Consider cardiac
catheterization or viability assessment. Consider symptom-guided
anti-ischemic pharmacotherapy as well as risk factor modification
per guideline directed care.

7. CAD-RADS N: Non-diagnostic study. Obstructive CAD can't be
excluded. Alternative evaluation is recommended.

*** End of Addendum ***
EXAM:
OVER-READ INTERPRETATION  CT CHEST

The following report is an over-read performed by radiologist Dr.
NOMASIBULELE [REDACTED] on [DATE]. This over-read
does not include interpretation of cardiac or coronary anatomy or
pathology. The coronary CTA interpretation by the cardiologist is
attached.
FINDINGS: Vascular: No significant vascular findings. Normal heart size. No
pericardial effusion.

Mediastinum/Nodes: No enlarged mediastinal or axillary lymph nodes.
Thyroid gland, trachea, and esophagus demonstrate no significant
findings.

Lungs/Pleura: Lungs are clear. No pleural effusion or pneumothorax.
No suspicious pulmonary nodules.

Upper Abdomen: No acute abnormality.

Musculoskeletal: No chest wall mass or suspicious bone lesions
identified.
IMPRESSION: No acute extracardiac findings in the chest. Interpretation of
cardiac and coronary anatomy by cardiology to follow.

## 2022-02-05 MED ORDER — NITROGLYCERIN 0.4 MG SL SUBL
0.8000 mg | SUBLINGUAL_TABLET | Freq: Once | SUBLINGUAL | Status: AC
  Administered 2022-02-05: 0.8 mg via SUBLINGUAL

## 2022-02-05 MED ORDER — IOHEXOL 350 MG/ML SOLN
95.0000 mL | Freq: Once | INTRAVENOUS | Status: AC | PRN
Start: 2022-02-05 — End: 2022-02-05
  Administered 2022-02-05: 95 mL via INTRAVENOUS

## 2022-02-05 MED ORDER — NITROGLYCERIN 0.4 MG SL SUBL
SUBLINGUAL_TABLET | SUBLINGUAL | Status: AC
  Filled 2022-02-05: qty 2

## 2022-02-06 ENCOUNTER — Other Ambulatory Visit: Payer: Self-pay | Admitting: Gastroenterology

## 2022-02-06 DIAGNOSIS — R1310 Dysphagia, unspecified: Secondary | ICD-10-CM

## 2022-02-07 ENCOUNTER — Other Ambulatory Visit: Payer: Self-pay

## 2022-02-07 ENCOUNTER — Ambulatory Visit
Admission: RE | Admit: 2022-02-07 | Discharge: 2022-02-07 | Disposition: A | Discharge status: Home / Self Care | Payer: BC Managed Care – PPO | Source: Ambulatory Visit | Attending: Gastroenterology | Admitting: Gastroenterology

## 2022-02-07 DIAGNOSIS — R1310 Dysphagia, unspecified: Secondary | ICD-10-CM | POA: Diagnosis not present

## 2022-02-07 DIAGNOSIS — K224 Dyskinesia of esophagus: Secondary | ICD-10-CM | POA: Diagnosis not present

## 2022-02-07 DIAGNOSIS — K219 Gastro-esophageal reflux disease without esophagitis: Secondary | ICD-10-CM | POA: Diagnosis not present

## 2022-02-07 IMAGING — RF DG ESOPHAGUS
10 series · 14 of 24 positions shown · non-contrast
Comparison: None.

CLINICAL DATA: Dysphagia with left chest discomfort and globus
sensation in the upper chest for 2 years.

EXAM:
ESOPHOGRAM / BARIUM SWALLOW / BARIUM TABLET STUDY
TECHNIQUE: Combined double contrast and single contrast examination performed
using effervescent crystals, thick barium liquid, and thin barium
liquid. The patient was observed with fluoroscopy swallowing a 13 mm
barium sulphate tablet.
FLUOROSCOPY:
Radiation Exposure Index (as provided by the fluoroscopic device):
21 mGy Kerma

[Series 1: sequence · 1 of 20 frames shown (1 of 7)]
[frame 4/20]
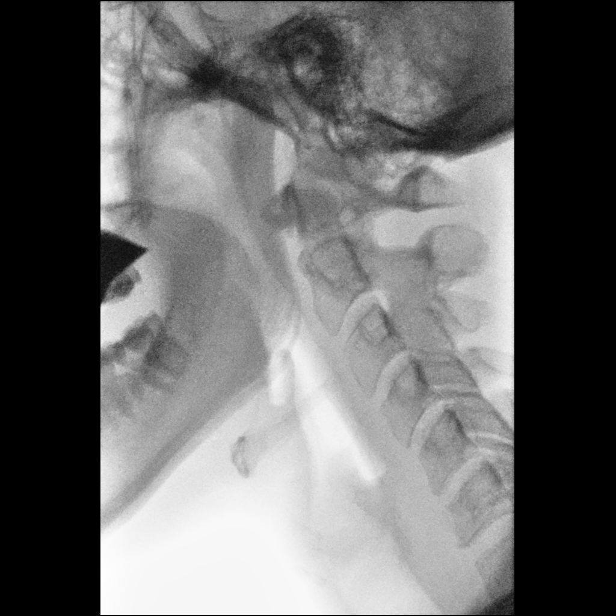

[Series 2: one shot · 0.15mm/px · 1 of 1 slices shown (1 of 3)]
[im 1/1]
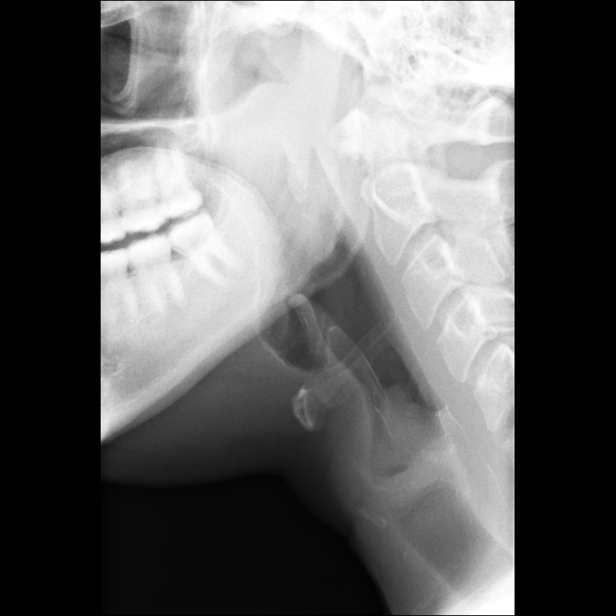

[Series 3: sequence · 1 of 11 frames shown (2 of 7)]
[frame 6/11]
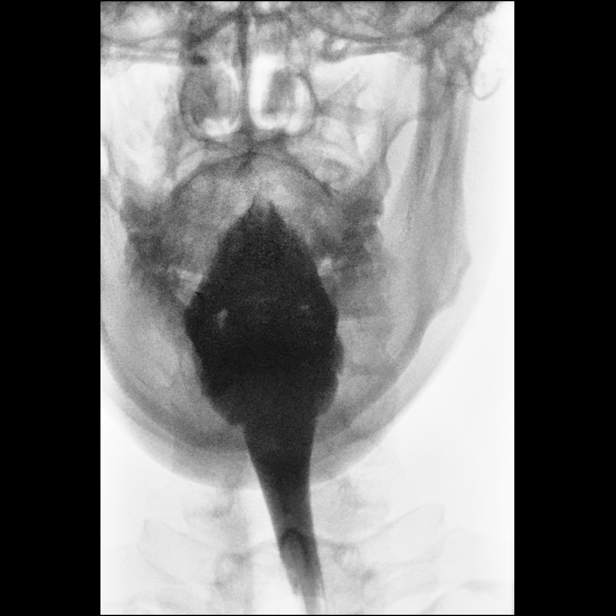

[Series 4: sequence · 2 of 11 frames shown (3 of 7)]
[frame 6/11]
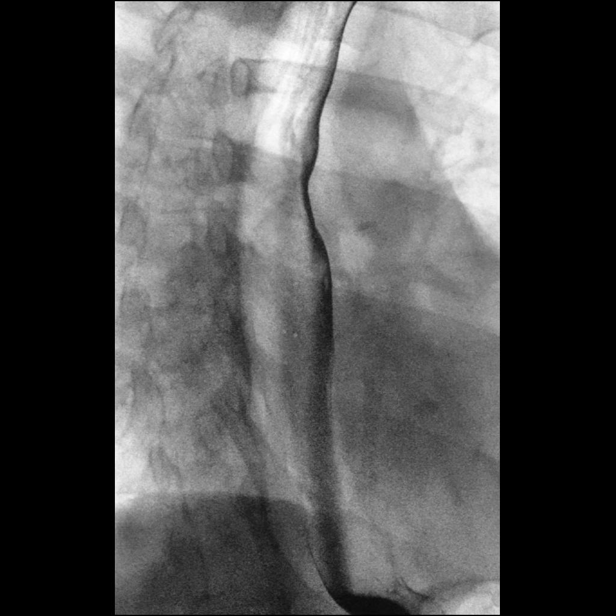
[frame 7/11]
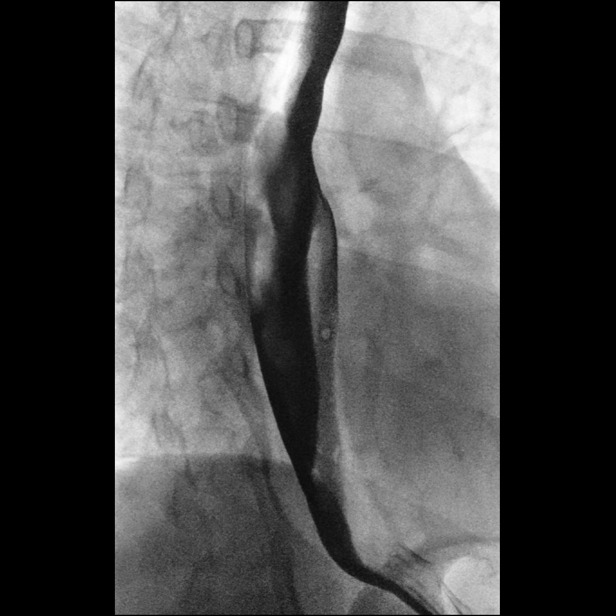

[Series 5: one shot · 0.15mm/px · 3 of 6 slices shown (2 of 3)]
[im 2/6]
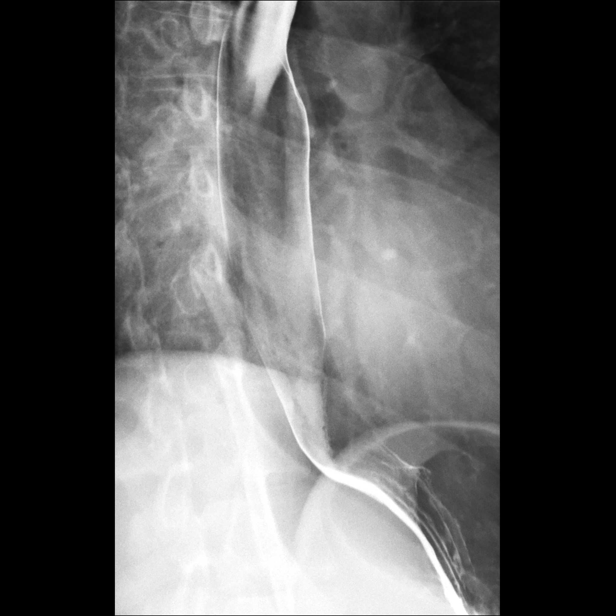
[im 5/6]
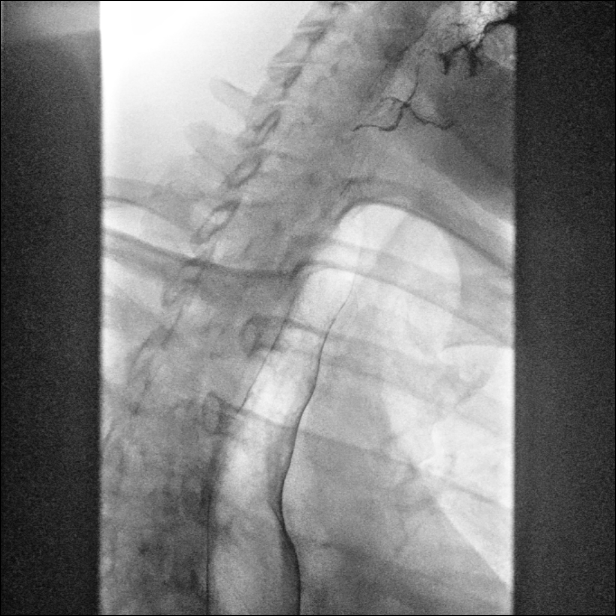
[im 6/6]
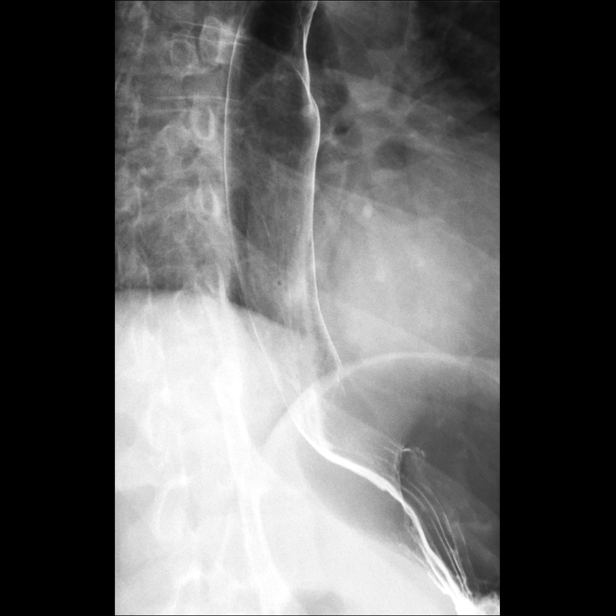

[Series 6: sequence · 1 of 41 frames shown (4 of 7)]
[frame 21/41]
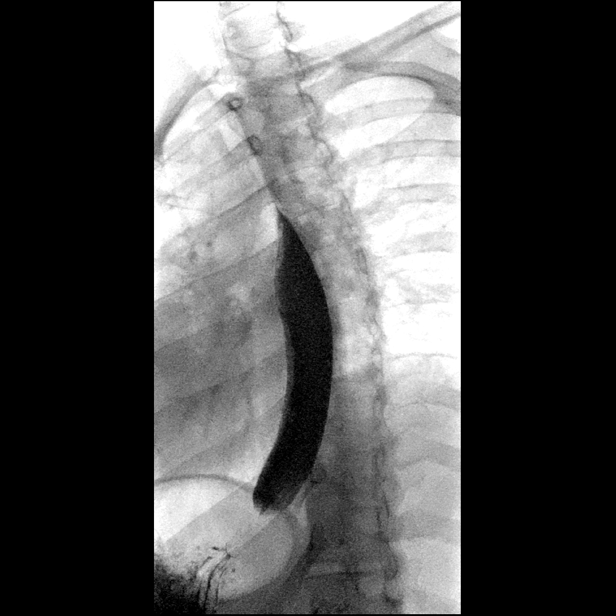

[Series 7: sequence · 1 of 64 frames shown (5 of 7)]
[frame 22/64]
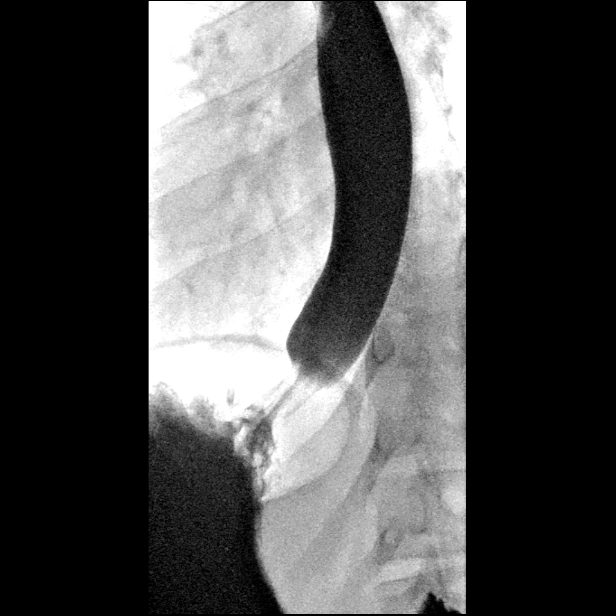

[Series 8: sequence · 2 of 20 frames shown (6 of 7)]
[frame 4/20]
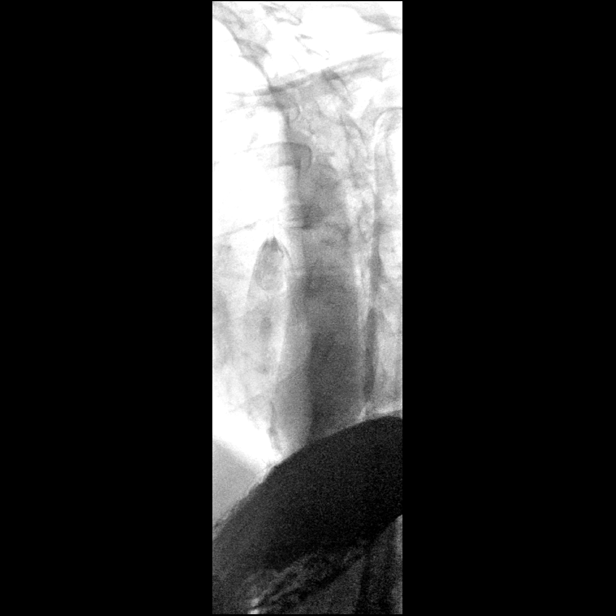
[frame 11/20]
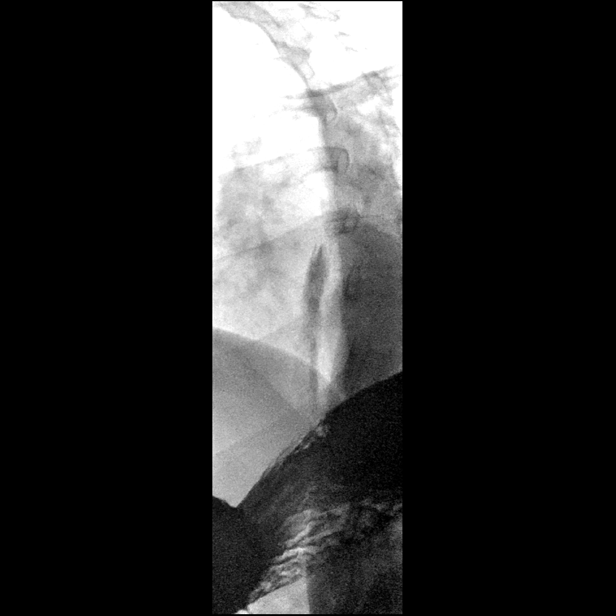

[Series 9: sequence · 1 of 25 frames shown (7 of 7)]
[frame 5/25]
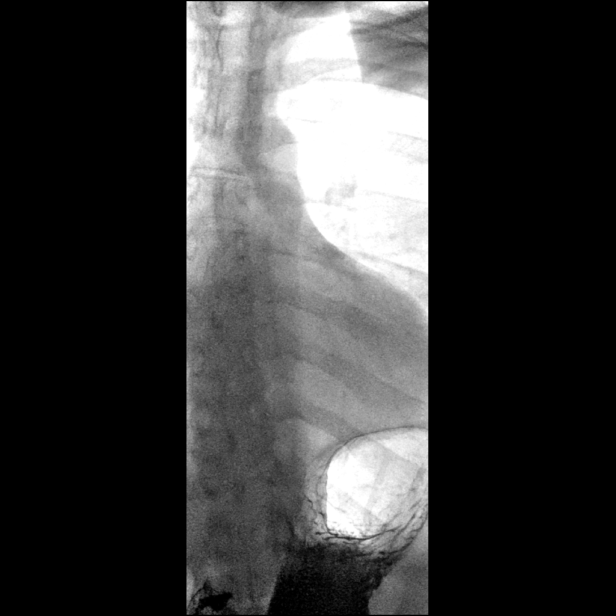

[Series 10: one shot · 1 of 1 slices shown (3 of 3)]
[im 1/1]
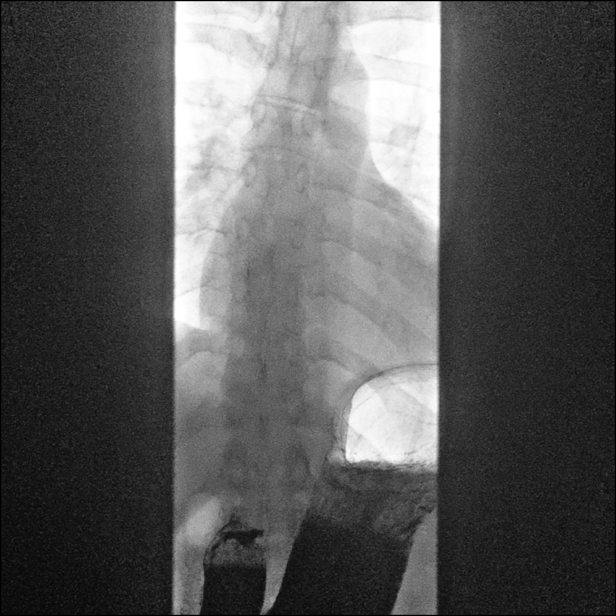

[14 of 24 positions shown; findings below may reference images not displayed]

FINDINGS: Normal oral and pharyngeal phases of swallowing, with no laryngeal
penetration or tracheobronchial aspiration. No significant barium
retention in the pharynx. No evidence of pharyngeal mass, stricture
or diverticulum. No cricopharyngeus muscle dysfunction.

No hiatal hernia. Mild gastroesophageal reflux elicited to the level
of the lower thoracic esophagus with water siphon test. Mild
esophageal dysmotility, characterized by mild intermittent weakening
of primary peristalsis in the lower thoracic esophagus. Normal
esophageal mucosa, with no evidence of reflux esophagitis. Normal
esophageal distensibility, with no evidence of esophageal mass,
ulcer or stricture. The barium tablet passed into the stomach
without delay.
IMPRESSION: 1. Mild gastroesophageal reflux elicited.  No hiatal hernia.
2. Mild esophageal dysmotility, with a chronic reflux related
dysmotility pattern.
3. Otherwise normal esophagram.

## 2022-02-26 ENCOUNTER — Ambulatory Visit: Payer: BC Managed Care – PPO | Admitting: Podiatry

## 2022-02-26 DIAGNOSIS — M7671 Peroneal tendinitis, right leg: Secondary | ICD-10-CM | POA: Diagnosis not present

## 2022-02-26 DIAGNOSIS — M7672 Peroneal tendinitis, left leg: Secondary | ICD-10-CM | POA: Diagnosis not present

## 2022-03-03 NOTE — Progress Notes (Signed)
?Subjective:  ?Patient ID: Rodney Austin, male    DOB: September 19, 1991,  MRN: [id number] ? ?Follow-up peroneal tendinitis ? ?31 y.o. male returns for follow-up with the above complaint. History confirmed with patient.  Not quite 100% but doing much better than it was last time he has completed therapy ? ?Objective:  ?Physical Exam: ?warm, good capillary refill, no trophic changes or ulcerative lesions, normal DP and PT pulses and normal sensory exam.  Improved quite a bit now, minimal pain on the peroneals only PT tendon good 5 out of 5 strength.  Collapse of the medial arch with heel valgus on weightbearing.  Able to do single and double heel rises.  Gastrocnemius equinus with a positive Silfverskiold test.  He has hammertoes and mild hallux valgus deformity bilaterally.   ? ? ? ? ?Radiographs: ?X-ray of both feet: Pes planus deformity with spurring at the talonavicular and subtalar joint.  Metatarsus primus elevatus is noted.  He has calcaneal valgus with mild forefoot abduction.  On the oblique view there is some irregularity of the anterior process of calcaneus, no gross osseous coalition is noted ? ?Study Result ? ?Narrative & Impression  ?CLINICAL DATA:  Bilateral lateral and plantar ankle pain for years. ?No known injury or prior relevant surgery. ?  ?EXAM: ?MRI OF THE LEFT ANKLE WITHOUT CONTRAST ?  ?TECHNIQUE: ?Multiplanar, multisequence MR imaging of the ankle was performed. No ?intravenous contrast was administered. ?  ?COMPARISON:  Radiographs 02/20/2021. ?  ?FINDINGS: ?TENDONS ?  ?Peroneal: Intact and normally positioned. ?  ?Posteromedial: Intact and normally positioned. Prominent fluid ?within the flexor hallucis longus tendon sheath, likely due to ?communication with the joint. A small amount of fluid is present in ?the posterior tibialis tendon sheath. ?  ?Anterior: Intact and normally positioned. ?  ?Achilles: Intact. ?  ?Plantar Fascia: Intact. ?  ?LIGAMENTS ?  ?Lateral: The anterior talofibular ligament  is not well visualized ?and may be chronically torn. The posterior talofibular and ?calcaneofibular ligaments appear intact. ?  ?Medial: The deltoid and visualized portions of the spring ligament ?appear intact. ?  ?CARTILAGE AND BONES ?  ?Ankle Joint: No significant ankle joint effusion. The talar dome and ?tibial plafond are intact. ?  ?Subtalar Joints/Sinus Tarsi: Unremarkable. ?  ?Bones: Mild talonavicular spurring without evidence of tarsal ?coalition. Underlying lipoma anteriorly in the calcaneus with fatty ?and cystic components. No evidence of pathologic fracture. ?  ?Other: No significant soft tissue findings. ?  ?IMPRESSION: ?1. Prominent fluid in the flexor hallucis longus and posterior ?tibialis tendon sheaths. No evidence of tendon tear. ?2. 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 ?3. Talonavicular spurring without acute osseous findings or evidence ?of tarsal coalition. ?  ?  ?Electronically Signed ?  By: Carey Bullocks M.D. ?  On: 03/05/2021 16:45  ? ?Study Result ? ?Narrative & Impression  ?CLINICAL DATA:  Bilateral lateral and plantar ankle pain for years. ?No known injury or prior relevant surgery. ?  ?EXAM: ?MRI OF THE RIGHT ANKLE WITHOUT CONTRAST ?  ?TECHNIQUE: ?Multiplanar, multisequence MR imaging of the ankle was performed. No ?intravenous contrast was administered. ?  ?COMPARISON:  Foot radiographs 02/20/2021 ?  ?FINDINGS: ?TENDONS ?  ?Peroneal: Intact and normally positioned. Possible mild ?tenosynovitis of the peroneus longus tendon where it passes along ?the inferolateral aspect of the calcaneus and cuboid. ?  ?Posteromedial: Intact and normally positioned. ?  ?Anterior: Intact and normally positioned. ?  ?Achilles: Intact. ?  ?Plantar Fascia: Intact. ?  ?LIGAMENTS ?  ?Lateral: The  anterior and posterior talofibular and calcaneofibular ?ligaments are intact. ?  ?Medial: The deltoid and visualized portions of the spring ligament ?appear intact. ?  ?CARTILAGE AND BONES ?   ?Ankle Joint: No significant ankle joint effusion. The talar dome and ?tibial plafond are intact. ?  ?Subtalar Joints/Sinus Tarsi: Unremarkable. ?  ?Bones: Mild talonavicular degenerative changes. No acute osseous ?findings. No evidence of tarsal coalition. ?  ?Other: No significant soft tissue findings. ?  ?IMPRESSION: ?1. Possible mild distal peroneus longus tenosynovitis without tear. ?2. Mild talonavicular spurring. ?3. No acute osseous or ligamentous findings. ?  ?  ?Electronically Signed ?  By: Carey Bullocks M.D. ?  On: 03/05/2021 16:40  ? ? ?Assessment:  ? ?1. Peroneal tendinitis of both lower legs   ? ? ? ?Plan:  ?Patient was evaluated and treated and all questions answered. ? ?Overall he is doing much better after having the injection and physical therapy.  He is out of the boot back to regular shoe gear.  Advised to continue his home therapy plan.  I will see him back as needed at this point. ? ?Return if symptoms worsen or fail to improve.  ? ? ?

## 2022-05-09 DIAGNOSIS — K2970 Gastritis, unspecified, without bleeding: Secondary | ICD-10-CM | POA: Diagnosis not present

## 2022-05-09 DIAGNOSIS — K2930 Chronic superficial gastritis without bleeding: Secondary | ICD-10-CM | POA: Diagnosis not present

## 2022-05-09 DIAGNOSIS — R12 Heartburn: Secondary | ICD-10-CM | POA: Diagnosis not present

## 2022-06-24 ENCOUNTER — Other Ambulatory Visit: Payer: Self-pay | Admitting: Internal Medicine

## 2022-06-24 ENCOUNTER — Ambulatory Visit
Admission: RE | Admit: 2022-06-24 | Discharge: 2022-06-24 | Disposition: A | Discharge status: Home / Self Care | Payer: BC Managed Care – PPO | Source: Ambulatory Visit | Attending: Internal Medicine | Admitting: Internal Medicine

## 2022-06-24 DIAGNOSIS — S29011D Strain of muscle and tendon of front wall of thorax, subsequent encounter: Secondary | ICD-10-CM | POA: Diagnosis not present

## 2022-06-24 DIAGNOSIS — R079 Chest pain, unspecified: Secondary | ICD-10-CM | POA: Diagnosis not present

## 2022-06-24 DIAGNOSIS — T148XXA Other injury of unspecified body region, initial encounter: Secondary | ICD-10-CM | POA: Diagnosis not present

## 2022-06-24 DIAGNOSIS — F411 Generalized anxiety disorder: Secondary | ICD-10-CM | POA: Diagnosis not present

## 2022-06-27 DIAGNOSIS — M25512 Pain in left shoulder: Secondary | ICD-10-CM | POA: Diagnosis not present

## 2022-07-30 ENCOUNTER — Encounter (HOSPITAL_COMMUNITY): Payer: Self-pay | Admitting: Emergency Medicine

## 2022-07-30 ENCOUNTER — Other Ambulatory Visit: Payer: Self-pay

## 2022-07-30 ENCOUNTER — Emergency Department (HOSPITAL_COMMUNITY)
Admission: EM | Admit: 2022-07-30 | Discharge: 2022-07-30 | Disposition: A | Discharge status: Home / Self Care | Payer: No Typology Code available for payment source | Attending: Student | Admitting: Student

## 2022-07-30 DIAGNOSIS — Y990 Civilian activity done for income or pay: Secondary | ICD-10-CM | POA: Insufficient documentation

## 2022-07-30 DIAGNOSIS — S61412A Laceration without foreign body of left hand, initial encounter: Secondary | ICD-10-CM | POA: Insufficient documentation

## 2022-07-30 DIAGNOSIS — S6992XA Unspecified injury of left wrist, hand and finger(s), initial encounter: Secondary | ICD-10-CM | POA: Diagnosis present

## 2022-07-30 DIAGNOSIS — Z23 Encounter for immunization: Secondary | ICD-10-CM | POA: Insufficient documentation

## 2022-07-30 DIAGNOSIS — W268XXA Contact with other sharp object(s), not elsewhere classified, initial encounter: Secondary | ICD-10-CM | POA: Diagnosis not present

## 2022-07-30 DIAGNOSIS — Y93G1 Activity, food preparation and clean up: Secondary | ICD-10-CM | POA: Diagnosis not present

## 2022-07-30 MED ORDER — TETANUS-DIPHTH-ACELL PERTUSSIS 5-2.5-18.5 LF-MCG/0.5 IM SUSY
0.5000 mL | PREFILLED_SYRINGE | Freq: Once | INTRAMUSCULAR | Status: AC
  Administered 2022-07-30: 0.5 mL via INTRAMUSCULAR
  Filled 2022-07-30: qty 0.5

## 2022-07-30 NOTE — ED Provider Notes (Signed)
[facility] EMERGENCY DEPARTMENT Provider Note   CSN: [id number] Arrival date & time: 07/30/22  2136     History  Chief Complaint  Patient presents with   Hand Laceration     Rodney Austin is a 31 y.o. male.  HPI   Patient without significant medical history resents with complaints of a laceration to his left hand.  Patient states he sustained this while at work, states he was cleaning some dishes and his hand hit something metal, states he has a small cut on the palmar aspect between the webs of his thumb and index finger, states he is able control the bleeding with direct pressure, he denies any paresthesia or weakness in his fingers, he is able to move all of his fingers without difficulty.  He is not immunocompromise, just needs a tetanus shot at this point he does not run last time he had a tetanus shot.  Home Medications Prior to Admission medications   Medication Sig Start Date End Date Taking? Authorizing Provider  amLODipine (NORVASC) 2.5 MG tablet Take 1 tablet by mouth daily. 01/03/22   [provider]  calcium carbonate (TUMS - DOSED IN MG ELEMENTAL CALCIUM) 500 MG chewable tablet Chew 1 tablet by mouth as needed. Patient not taking: Reported on 02/05/2022    [provider]  metoprolol tartrate (LOPRESSOR) 100 MG tablet Take one tablet by mouth 2 hours prior to CT Scan 01/23/22   Corky Crafts, MD      Allergies    Patient has no known allergies.    Review of Systems   Review of Systems  Constitutional:  Negative for chills and fever.  Respiratory:  Negative for shortness of breath.   Cardiovascular:  Negative for chest pain.  Gastrointestinal:  Negative for abdominal pain.  Skin:  Positive for wound.  Neurological:  Negative for headaches.    Physical Exam Updated Vital Signs BP (!) 134/92   Pulse 89   Temp 98.3 F (36.8 C) (Oral)   Resp 16   SpO2 98%  Physical Exam Vitals and nursing note reviewed.   Constitutional:      General: He is not in acute distress.    Appearance: He is not ill-appearing.  HENT:     Head: Normocephalic and atraumatic.     Nose: No congestion.  Eyes:     Conjunctiva/sclera: Conjunctivae normal.  Cardiovascular:     Rate and Rhythm: Normal rate and regular rhythm.     Pulses: Normal pulses.  Musculoskeletal:     Comments: Patient has a superficial laceration between the thumb and index finger, not on the palmar aspect, approximately half a centimeter in length, half millimeter in depth, hemodynamically stable, no evidence of foreign body, nongaping, no notable ligament or tendon damage present, he has full range of motion in his thumb and index finger, neurovascular fully intact, 2-second capillary refill.  Skin:    General: Skin is warm and dry.  Neurological:     Mental Status: He is alert.  Psychiatric:        Mood and Affect: Mood normal.     ED Results / Procedures / Treatments   Labs (all labs ordered are listed, but only abnormal results are displayed) Labs Reviewed - No data to display  EKG None  Radiology No results found.  Procedures Procedures    Medications Ordered in ED Medications - No data to display  ED Course/ Medical Decision Making/ A&P  Medical Decision Making  This patient presents to the ED for concern of laceration to left hand, this involves an extensive number of treatment options, and is a complaint that carries with it a high risk of complications and morbidity.  The differential diagnosis includes foreign body, ligament, tendon damage,    Additional history obtained:  Additional history obtained from N/A External records from outside source obtained and reviewed including immunization record   Co morbidities that complicate the patient evaluation  N/A  Social Determinants of Health:  N/A    Lab Tests:  I Ordered, and personally interpreted labs.  The pertinent results  include: N/A   Imaging Studies ordered:  I ordered imaging studies including N/A I independently visualized and interpreted imaging which showed N/A I agree with the radiologist interpretation   Cardiac Monitoring:  The patient was maintained on a cardiac monitor.  I personally viewed and interpreted the cardiac monitored which showed an underlying rhythm of: N/A   Medicines ordered and prescription drug management:  I ordered medication including Tdap I have reviewed the patients home medicines and have made adjustments as needed  Critical Interventions:  N/A   Reevaluation:  Patient had a benign physical exam, he is agreement with plan and discharge at this time.  Tetanus shot will be updated.  Consultations Obtained:  N/A    Test Considered:  X-ray-deferred respiration for fracture or foreign body is very low at this time, he had a low mechanism of injury, there is no deformities present, wound was superficial nature, did not see any foreign bodies present.    Rule out  low suspicion for ligament or tendon damage as area was palpated no gross defects noted, he had full range of motion in his left thumb and index finger.  Low suspicion for compartment syndrome as area was palpated it was soft to the touch, neurovascular fully intact.   Dispostion and problem list  After consideration of the diagnostic results and the patients response to treatment, I feel that the patent would benefit from discharge.  Laceration-superficial nature, did not require laceration repair, tetanus updated will provide with basic wound care, as well as strict return precautions.            Final Clinical Impression(s) / ED Diagnoses Final diagnoses:  None    Rx / DC Orders ED Discharge Orders     None         Carroll Sage, PA-C 07/30/22 2247    Charlynne Pander, MD 07/31/22 [phone]

## 2022-07-30 NOTE — Discharge Instructions (Addendum)
You have a small cut on your hand, it is shallow, and should heal on its own, I recommend keep the area clean, rinse it out apply Neosporin on it and then put on a new Band-Aid do this daily.  May use over-the-counter pain medication as needed.  I recommend at work wearing gloves until it is fully healed.  If you notice that after 4 days of keeping it clean, you start to notice increasing redness swelling pain it is possible that it is infected and you will need to be reassessed please come back in for further evaluation.

## 2022-07-30 NOTE — ED Triage Notes (Addendum)
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 Minimal bleeding .

## 2022-09-02 ENCOUNTER — Ambulatory Visit
Admission: EM | Admit: 2022-09-02 | Discharge: 2022-09-02 | Disposition: A | Discharge status: Home / Self Care | Payer: BC Managed Care – PPO | Attending: Physician Assistant | Admitting: Physician Assistant

## 2022-09-02 DIAGNOSIS — Z1152 Encounter for screening for COVID-19: Secondary | ICD-10-CM | POA: Diagnosis not present

## 2022-09-02 DIAGNOSIS — J069 Acute upper respiratory infection, unspecified: Secondary | ICD-10-CM | POA: Insufficient documentation

## 2022-09-02 DIAGNOSIS — U071 COVID-19: Secondary | ICD-10-CM | POA: Diagnosis not present

## 2022-09-02 LAB — RESP PANEL BY RT-PCR (FLU A&B, COVID) ARPGX2
Influenza A by PCR: NEGATIVE
Influenza B by PCR: NEGATIVE
SARS Coronavirus 2 by RT PCR: POSITIVE — AB

## 2022-09-02 MED ORDER — ACETAMINOPHEN 325 MG PO TABS
650.0000 mg | ORAL_TABLET | Freq: Once | ORAL | Status: AC
  Administered 2022-09-02: 650 mg via ORAL

## 2022-09-02 NOTE — ED Triage Notes (Signed)
Pt c/o fatigue, malaise, chills, fever, headache, onset ~ yesterday 2/2 covid exposure

## 2022-09-02 NOTE — ED Provider Notes (Signed)
[facility]    CSN: [id number] Arrival date & time: 09/02/22  0813      History   Chief Complaint Chief Complaint  Patient presents with   Covid Exposure    HPI Rodney Austin is a 31 y.o. male.   Patient here today for evaluation of fever, congestion, fatigue and headache that started yesterday. He reports he has had exposure to covid. He has been taking OTC meds with mild relief. He has had nausea but no vomiting or diarrhea.   The history is provided by the patient.    Past Medical History:  Diagnosis Date   Anxiety    Asthma    Bilateral knee pain    Chronic pain    Depression    GERD (gastroesophageal reflux disease)    HTN (hypertension)    Hypothyroidism    Leukopenia    Tricuspid valve insufficiency     Patient Active Problem List   Diagnosis Date Noted   Leukopenia 01/18/2022   Regular astigmatism, unspecified eye 09/20/2021   Chronic pain 02/19/2021   Generalized anxiety disorder 02/19/2021   Severe major depression, single episode, without psychotic features ([place]) 02/19/2021   Myopia 07/25/2019   Myopia, bilateral 10/26/2014   Regular astigmatism, bilateral 10/26/2014   Asthma 01/28/2014    History reviewed. No pertinent surgical history.     Home Medications    Prior to Admission medications   Medication Sig Start Date End Date Taking? Authorizing Provider  amLODipine (NORVASC) 2.5 MG tablet Take 1 tablet by mouth daily. 01/03/22   [provider]  calcium carbonate (TUMS - DOSED IN MG ELEMENTAL CALCIUM) 500 MG chewable tablet Chew 1 tablet by mouth as needed. Patient not taking: Reported on 02/05/2022    [provider]  FLUoxetine (PROZAC) 20 MG capsule Take 20 mg by mouth daily. 07/26/22   [provider]  metoprolol tartrate (LOPRESSOR) 100 MG tablet Take one tablet by mouth 2 hours prior to CT Scan 01/23/22   Jettie Booze, MD    Family History Family History  Problem Relation Age of Onset    Hypertension Mother    Hypertension Maternal Uncle    Thyroid disease Maternal Grandmother    Hypertension Maternal Grandfather     Social History Social History   Tobacco Use   Smoking status: Former    Types: Cigarettes    Passive exposure: Never   Smokeless tobacco: Never  Vaping Use   Vaping Use: Every day     Allergies   Patient has no known allergies.   Review of Systems Review of Systems  Constitutional:  Positive for chills, fatigue and fever.  HENT:  Positive for congestion. Negative for sore throat.   Eyes:  Negative for discharge and redness.  Respiratory:  Negative for cough and shortness of breath.   Gastrointestinal:  Positive for nausea. Negative for diarrhea and vomiting.  Neurological:  Negative for numbness.     Physical Exam Triage Vital Signs ED Triage Vitals  Enc Vitals Group     BP      Pulse      Resp      Temp      Temp src      SpO2      Weight      Height      Head Circumference      Peak Flow      Pain Score      Pain Loc      Pain  Edu?      Excl. in GC?    No data found.  Updated Vital Signs BP 115/70 (BP Location: Left Arm)   Pulse 89   Temp (!) 101.9 F (38.8 C) (Oral)   Resp 16   SpO2 98%      Physical Exam Vitals and nursing note reviewed.  Constitutional:      General: He is not in acute distress.    Appearance: Normal appearance. He is not ill-appearing.  HENT:     Head: Normocephalic and atraumatic.  Eyes:     Conjunctiva/sclera: Conjunctivae normal.  Cardiovascular:     Rate and Rhythm: Normal rate.  Pulmonary:     Effort: Pulmonary effort is normal.  Neurological:     Mental Status: He is alert.  Psychiatric:        Mood and Affect: Mood normal.        Behavior: Behavior normal.        Thought Content: Thought content normal.      UC Treatments / Results  Labs (all labs ordered are listed, but only abnormal results are displayed) Labs Reviewed  RESP PANEL BY RT-PCR (FLU A&B, COVID) [id number]     EKG   Radiology No results found.  Procedures Procedures (including critical care time)  Medications Ordered in UC Medications  acetaminophen (TYLENOL) tablet 650 mg (650 mg Oral Given 09/02/22 0830)    Initial Impression / Assessment and Plan / UC Course  I have reviewed the triage vital signs and the nursing notes.  Pertinent labs & imaging results that were available during my care of the patient were reviewed by me and considered in my medical decision making (see chart for details).    Will order covid and flu screening. Will await results for further recommendation- encouraged symptomatic treatment, increased fluids and rest. Recommend follow up if symptoms persist or worsen in any way.   Final Clinical Impressions(s) / UC Diagnoses   Final diagnoses:  Acute upper respiratory infection  Encounter for screening for COVID-19   Discharge Instructions   None    ED Prescriptions   None    PDMP not reviewed this encounter.   Tomi Bamberger, PA-C 09/02/22 [phone]

## 2022-09-24 ENCOUNTER — Ambulatory Visit: Payer: BC Managed Care – PPO

## 2022-09-24 ENCOUNTER — Ambulatory Visit: Payer: BC Managed Care – PPO | Admitting: Podiatry

## 2022-09-24 DIAGNOSIS — M7751 Other enthesopathy of right foot: Secondary | ICD-10-CM

## 2022-09-24 NOTE — Progress Notes (Addendum)
  Subjective:  Patient ID: Rodney Austin, male    DOB: 1991-02-20,  MRN: [id number]  Chief Complaint  Patient presents with   Toe Pain     Pt R foot has pain in his toes.He was wearing work shoes that were too small/narrow. Feeling better, but still gets a sharp pain at the 3rd or 4th met, top of foot    31 y.o. male presents with the above complaint. History confirmed with patient.  Was quite severe pain causing cramping and locking up of the toes when he made the appointment but it starting to improve now  Objective:  Physical Exam: warm, good capillary refill, no trophic changes or ulcerative lesions, normal DP and PT pulses, normal sensory exam, and some tenderness in the third interspace on the right foot between the third and fourth metatarsals  Radiographs: Multiple views x-ray of the right foot taken today 09/24/22: No evidence of stress fracture noted Assessment:   1. Bursitis of intermetatarsal bursa of right foot      Plan:  Patient was evaluated and treated and all questions answered.  I reviewed his radiographs with him as well as my clinical exam findings.  Luckily there is no stress fracture, I discussed with him that he likely has intermetatarsal bursitis do not think this is a neuroma.  Discussed treatment of this including use of OTC anti-inflammatories which he will do in should continue to improve.  If it does not improve he will return for corticosteroid injection which did not feel was necessary today.  Return if symptoms worsen or fail to improve.

## 2022-10-04 DIAGNOSIS — G8929 Other chronic pain: Secondary | ICD-10-CM | POA: Diagnosis not present

## 2022-10-04 DIAGNOSIS — D72819 Decreased white blood cell count, unspecified: Secondary | ICD-10-CM | POA: Diagnosis not present

## 2022-10-04 DIAGNOSIS — Z23 Encounter for immunization: Secondary | ICD-10-CM | POA: Diagnosis not present

## 2022-10-04 DIAGNOSIS — Z0000 Encounter for general adult medical examination without abnormal findings: Secondary | ICD-10-CM | POA: Diagnosis not present

## 2022-10-07 DIAGNOSIS — F411 Generalized anxiety disorder: Secondary | ICD-10-CM | POA: Diagnosis not present

## 2022-10-21 DIAGNOSIS — F411 Generalized anxiety disorder: Secondary | ICD-10-CM | POA: Diagnosis not present

## 2022-10-28 DIAGNOSIS — M25562 Pain in left knee: Secondary | ICD-10-CM | POA: Diagnosis not present

## 2022-10-28 DIAGNOSIS — M25561 Pain in right knee: Secondary | ICD-10-CM | POA: Diagnosis not present

## 2022-11-01 DIAGNOSIS — M25562 Pain in left knee: Secondary | ICD-10-CM | POA: Diagnosis not present

## 2022-11-01 DIAGNOSIS — M25561 Pain in right knee: Secondary | ICD-10-CM | POA: Diagnosis not present

## 2022-11-04 DIAGNOSIS — M25561 Pain in right knee: Secondary | ICD-10-CM | POA: Diagnosis not present

## 2022-11-04 DIAGNOSIS — M25562 Pain in left knee: Secondary | ICD-10-CM | POA: Diagnosis not present

## 2022-11-29 DIAGNOSIS — M25561 Pain in right knee: Secondary | ICD-10-CM | POA: Diagnosis not present

## 2022-11-29 DIAGNOSIS — M25562 Pain in left knee: Secondary | ICD-10-CM | POA: Diagnosis not present

## 2022-12-04 ENCOUNTER — Ambulatory Visit: Payer: BC Managed Care – PPO | Admitting: Podiatry

## 2023-01-03 DIAGNOSIS — M25561 Pain in right knee: Secondary | ICD-10-CM | POA: Diagnosis not present

## 2023-01-03 DIAGNOSIS — M25562 Pain in left knee: Secondary | ICD-10-CM | POA: Diagnosis not present

## 2023-04-17 ENCOUNTER — Ambulatory Visit: Payer: Commercial Managed Care - HMO | Admitting: Podiatry

## 2023-04-17 DIAGNOSIS — M7671 Peroneal tendinitis, right leg: Secondary | ICD-10-CM

## 2023-04-17 DIAGNOSIS — M2141 Flat foot [pes planus] (acquired), right foot: Secondary | ICD-10-CM

## 2023-04-17 DIAGNOSIS — M2142 Flat foot [pes planus] (acquired), left foot: Secondary | ICD-10-CM

## 2023-04-17 DIAGNOSIS — M7751 Other enthesopathy of right foot: Secondary | ICD-10-CM | POA: Diagnosis not present

## 2023-04-17 DIAGNOSIS — M7672 Peroneal tendinitis, left leg: Secondary | ICD-10-CM

## 2023-04-17 DIAGNOSIS — M62462 Contracture of muscle, left lower leg: Secondary | ICD-10-CM | POA: Diagnosis not present

## 2023-04-17 DIAGNOSIS — M62461 Contracture of muscle, right lower leg: Secondary | ICD-10-CM | POA: Diagnosis not present

## 2023-04-17 NOTE — Patient Instructions (Signed)
Call [facility] to schedule your MRI at the below locations.  Please allow at least 1 business day after your visit to process the referral.  It may take longer depending on approval from insurance.  Please let me know if you have issues or problems scheduling the MRI   [facility]  [phone] [address] [place], [place] [address]  [facility] [place] [phone] [address] [place], [place] [address]  

## 2023-04-21 NOTE — Progress Notes (Signed)
  Subjective:  Patient ID: Rodney Austin, male    DOB: 19-Dec-1990,  MRN: [id number]  Chief Complaint  Patient presents with   Foot Pain    Rm 21 Follow up bilateral foot pain. Pt is in office today to figure out another solution to his foot pain besides routine injections. Pt states his left left lateral foot has increased but the right is constant. Pt meantioed he goes to the [place] as well to have his feet rechecked.     32 y.o. male presents with the above complaint. History confirmed with patient.  He returns for follow-up he is still utilizing orthotics, the injection we previously did has helped but continues to wax and wane and has started to return again, he had radiographs completed at the [place]  Objective:  Physical Exam: warm, good capillary refill, no trophic changes or ulcerative lesions, normal DP and PT pulses, normal sensory exam, and still has diffuse forefoot pain in the third interspace on the right foot, on the left foot he still has pain to palpation at the distal peroneus brevis at the insertion onto the fifth metatarsal base  Radiographs: He had previous x-rays taken last week at the [place] which did not show any osseous abnormalities in the area of concern there was hallux valgus deformity and mild metatarsal phalangeal joint arthritis noted Assessment:   1. Bursitis of intermetatarsal bursa of right foot   2. Peroneal tendinitis of both lower legs   3. Gastrocnemius equinus of left lower extremity   4. Gastrocnemius equinus of right lower extremity   5. Pes planus of both feet      Plan:  Patient was evaluated and treated and all questions answered.  Continues to have quite a bit of pain in both feet.  He does still have significant pes planus deformity and hallux valgus deformity, the majority of his pain is still on his left foot at the insertion of the fifth metatarsal base where his previous injection was completed.  This continues to wax and wane but is progressively  not improved.  His right foot shows symptoms still consistent with intermetatarsal bursitis that has not improved either.  I recommended advanced imaging and MRI of both feet has been ordered appropriately.  I will see him back following the studies. Return for after MRI to review.

## 2023-05-21 ENCOUNTER — Other Ambulatory Visit: Payer: Self-pay

## 2023-06-23 ENCOUNTER — Telehealth (INDEPENDENT_AMBULATORY_CARE_PROVIDER_SITE_OTHER): Payer: Commercial Managed Care - HMO | Admitting: Podiatry

## 2023-06-23 DIAGNOSIS — M2142 Flat foot [pes planus] (acquired), left foot: Secondary | ICD-10-CM

## 2023-06-23 DIAGNOSIS — M7671 Peroneal tendinitis, right leg: Secondary | ICD-10-CM

## 2023-06-23 DIAGNOSIS — M7672 Peroneal tendinitis, left leg: Secondary | ICD-10-CM

## 2023-06-23 DIAGNOSIS — M2141 Flat foot [pes planus] (acquired), right foot: Secondary | ICD-10-CM

## 2023-06-23 DIAGNOSIS — M7751 Other enthesopathy of right foot: Secondary | ICD-10-CM

## 2023-06-23 NOTE — Telephone Encounter (Signed)
[employer] representative Marciano Sequin called to inform requests for MRIs were denied. Approval requires 6 weeks of treatment to include PT, oral medications and/or documented injections. You may ask for a peer to peer at [phone], reference number [id number].  1. Bursitis of intermetatarsal bursa of right foot   2. Peroneal tendinitis of both lower legs   3. Pes planus of both feet

## 2023-06-26 ENCOUNTER — Encounter: Payer: Self-pay | Admitting: Podiatry

## 2023-06-26 NOTE — Telephone Encounter (Signed)
I appealed his denial and has been approved, study good through 11/10/2023
